# Patient Record
Sex: Female | Born: 1950 | Race: Black or African American | Hispanic: No | Smoking: Never smoker
Health system: Southern US, Community
[De-identification: ages and names within clinical notes are randomized; demographics above are authoritative.]

## PROBLEM LIST (undated history)

## (undated) DIAGNOSIS — K5792 Diverticulitis of intestine, part unspecified, without perforation or abscess without bleeding: Secondary | ICD-10-CM

## (undated) DIAGNOSIS — E785 Hyperlipidemia, unspecified: Secondary | ICD-10-CM

## (undated) DIAGNOSIS — I10 Essential (primary) hypertension: Secondary | ICD-10-CM

## (undated) HISTORY — DX: Hyperlipidemia, unspecified: E78.5

## (undated) HISTORY — DX: Diverticulitis of intestine, part unspecified, without perforation or abscess without bleeding: K57.92

## (undated) HISTORY — DX: Essential (primary) hypertension: I10

---

## 2017-06-27 ENCOUNTER — Ambulatory Visit (INDEPENDENT_AMBULATORY_CARE_PROVIDER_SITE_OTHER): Payer: BLUE CROSS/BLUE SHIELD | Admitting: Family Medicine

## 2017-06-27 ENCOUNTER — Other Ambulatory Visit: Payer: Self-pay

## 2017-06-27 VITALS — BP 144/78 | HR 100 | Temp 98.8°F | Ht 60.0 in | Wt 259.0 lb

## 2017-06-27 DIAGNOSIS — Z7409 Other reduced mobility: Secondary | ICD-10-CM | POA: Diagnosis not present

## 2017-06-27 DIAGNOSIS — S8012XA Contusion of left lower leg, initial encounter: Secondary | ICD-10-CM

## 2017-06-27 DIAGNOSIS — M79662 Pain in left lower leg: Secondary | ICD-10-CM

## 2017-06-27 DIAGNOSIS — K5900 Constipation, unspecified: Secondary | ICD-10-CM | POA: Diagnosis not present

## 2017-06-27 DIAGNOSIS — M7989 Other specified soft tissue disorders: Secondary | ICD-10-CM | POA: Diagnosis not present

## 2017-06-27 MED ORDER — POLYETHYLENE GLYCOL 3350 17 GM/SCOOP PO POWD: 17.0000 g | Freq: Every day | ORAL | 1 refills | Status: DC | PRN

## 2017-06-27 NOTE — Patient Instructions (Signed)
     IF you received an x-ray today, you will receive an invoice from [facility]. Please contact [facility] at [phone] with questions or concerns regarding your invoice.   IF you received labwork today, you will receive an invoice from [facility]. Please contact [facility] at [phone] with questions or concerns regarding your invoice.   Our billing staff will not be able to assist you with questions regarding bills from these companies.  You will be contacted with the lab results as soon as they are available. The fastest way to get your results is to activate your My Chart account. Instructions are located on the last page of this paperwork. If you have not heard from us regarding the results in 2 weeks, please contact this office.     

## 2017-06-29 ENCOUNTER — Encounter: Payer: Self-pay | Admitting: Family Medicine
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
## 2017-07-01 ENCOUNTER — Telehealth: Payer: Self-pay | Admitting: Family Medicine
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
## 2017-07-01 NOTE — Telephone Encounter (Signed)
Completed forms on your desk

## 2017-07-04 NOTE — Telephone Encounter (Signed)
Please advise 

## 2017-07-04 NOTE — Telephone Encounter (Signed)
Paperwork scanned and faxed to number on the sheet on 07/04/17

## 2017-07-04 NOTE — Telephone Encounter (Signed)
Copied from CRM [id number]. Topic: Inquiry >> Jul 04, 2017  9:53 AM Landry MellowFoltz, Melissa J wrote: Reason for CRM: pt needs a letter for work stating the time line that she will be out of work.  Also, she is requesting an update on her fmla.   She has appt on Thursday, but would like to pick up early.  Cb number is [phone]

## 2017-07-07 ENCOUNTER — Telehealth: Payer: Self-pay | Admitting: Family Medicine

## 2017-07-07 ENCOUNTER — Encounter: Payer: Self-pay | Admitting: Family Medicine

## 2017-07-07 ENCOUNTER — Other Ambulatory Visit: Payer: Self-pay

## 2017-07-07 ENCOUNTER — Ambulatory Visit (INDEPENDENT_AMBULATORY_CARE_PROVIDER_SITE_OTHER): Payer: BLUE CROSS/BLUE SHIELD | Admitting: Family Medicine

## 2017-07-07 VITALS — BP 122/74 | HR 105 | Temp 98.8°F | Ht 66.54 in | Wt 260.0 lb

## 2017-07-07 DIAGNOSIS — S8012XD Contusion of left lower leg, subsequent encounter: Secondary | ICD-10-CM | POA: Diagnosis not present

## 2017-07-07 DIAGNOSIS — L97229 Non-pressure chronic ulcer of left calf with unspecified severity: Secondary | ICD-10-CM | POA: Diagnosis not present

## 2017-07-07 DIAGNOSIS — Z7409 Other reduced mobility: Secondary | ICD-10-CM

## 2017-07-07 DIAGNOSIS — M7989 Other specified soft tissue disorders: Secondary | ICD-10-CM | POA: Diagnosis not present

## 2017-07-07 DIAGNOSIS — M79662 Pain in left lower leg: Secondary | ICD-10-CM

## 2017-07-07 MED ORDER — CEPHALEXIN 500 MG PO CAPS
500.0000 mg | ORAL_CAPSULE | Freq: Four times a day (QID) | ORAL | 0 refills | Status: DC
Start: 2017-07-07 — End: 2017-10-21
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
## 2017-07-07 NOTE — Telephone Encounter (Signed)
Ok. So home health will probably not be covered. She needs wound care, her mobility is limited so I would have preferred home health but lets try outpatient wound care, this will hopefully be covered. Please process referral and let patient know. thanks
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
## 2017-07-07 NOTE — Patient Instructions (Signed)
     IF you received an x-ray today, you will receive an invoice from [facility]. Please contact [facility] at [phone] with questions or concerns regarding your invoice.   IF you received labwork today, you will receive an invoice from [facility]. Please contact [facility] at [phone] with questions or concerns regarding your invoice.   Our billing staff will not be able to assist you with questions regarding bills from these companies.  You will be contacted with the lab results as soon as they are available. The fastest way to get your results is to activate your My Chart account. Instructions are located on the last page of this paperwork. If you have not heard from us regarding the results in 2 weeks, please contact this office.     

## 2017-07-08 ENCOUNTER — Ambulatory Visit (HOSPITAL_COMMUNITY)
Admission: RE | Admit: 2017-07-08 | Discharge: 2017-07-08 | Disposition: A | Discharge status: Home / Self Care | Payer: BLUE CROSS/BLUE SHIELD | Source: Ambulatory Visit | Attending: Vascular Surgery | Admitting: Vascular Surgery

## 2017-07-08 ENCOUNTER — Telehealth: Payer: Self-pay | Admitting: Family Medicine

## 2017-07-08 ENCOUNTER — Encounter: Payer: Self-pay | Admitting: Family Medicine

## 2017-07-08 DIAGNOSIS — Z7409 Other reduced mobility: Secondary | ICD-10-CM | POA: Diagnosis not present

## 2017-07-08 DIAGNOSIS — M79662 Pain in left lower leg: Secondary | ICD-10-CM

## 2017-07-08 DIAGNOSIS — M7989 Other specified soft tissue disorders: Secondary | ICD-10-CM | POA: Diagnosis not present

## 2017-07-08 DIAGNOSIS — S8012XD Contusion of left lower leg, subsequent encounter: Secondary | ICD-10-CM

## 2017-07-08 DIAGNOSIS — X58XXXD Exposure to other specified factors, subsequent encounter: Secondary | ICD-10-CM | POA: Insufficient documentation

## 2017-07-08 NOTE — Telephone Encounter (Signed)
Signed letter on your desk

## 2017-07-08 NOTE — Telephone Encounter (Signed)
Noted, results not available yet

## 2017-07-08 NOTE — Telephone Encounter (Signed)
Patient scheduled for Vascular ultrasound of left leg at 3pm today.
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
## 2017-07-08 NOTE — Telephone Encounter (Signed)
Copied from CRM [id number]. Topic: Referral - Request >> Jul 08, 2017 11:22 AM Herby AbrahamJohnson, Shiquita C wrote: Reason for CRM: pt called in to request a referral to a vascular Dr. Rock NephewPt says that she is seeing wound care but also have a blood clot. Wound care advised that they are not able to assist with clot.    Please advise.

## 2017-07-11 NOTE — Telephone Encounter (Signed)
Letter was emailed to pt's daughter on 07/11/17

## 2017-07-12 NOTE — Telephone Encounter (Signed)
I spoke with [name], patient's daughter, regarding us. Patient is scheduled with wound care for tomorrow. Home health not approved.

## 2017-07-12 NOTE — Telephone Encounter (Signed)
Copied from CRM [phone]. Topic: General - Other >> Jul 12, 2017 12:48 PM Terisa Starraylor, Brittany L wrote: Pt's daughter would like a call back from Dr Leretha PolSantiago regarding the phone call they had this morning. Call back is [phone]

## 2017-07-12 NOTE — Telephone Encounter (Signed)
Spoke with daughter, discussed referral to gen surg

## 2017-07-14 ENCOUNTER — Encounter (HOSPITAL_BASED_OUTPATIENT_CLINIC_OR_DEPARTMENT_OTHER): Payer: BLUE CROSS/BLUE SHIELD | Attending: Internal Medicine

## 2017-07-14 DIAGNOSIS — L97822 Non-pressure chronic ulcer of other part of left lower leg with fat layer exposed: Secondary | ICD-10-CM | POA: Diagnosis not present

## 2017-07-14 DIAGNOSIS — L97322 Non-pressure chronic ulcer of left ankle with fat layer exposed: Secondary | ICD-10-CM | POA: Diagnosis present

## 2017-07-14 DIAGNOSIS — L97825 Non-pressure chronic ulcer of other part of left lower leg with muscle involvement without evidence of necrosis: Secondary | ICD-10-CM | POA: Insufficient documentation

## 2017-07-14 DIAGNOSIS — I10 Essential (primary) hypertension: Secondary | ICD-10-CM | POA: Insufficient documentation

## 2017-07-15 ENCOUNTER — Telehealth: Payer: Self-pay | Admitting: Family Medicine

## 2017-07-15 NOTE — Telephone Encounter (Signed)
Pt's daughter [name] called stating that she spoke with pt's insurance company as well as [facility] and they said they can take pt's insurance for Home Health. I sent the referral to [facility] at fax number [phone] on 1/11. [name] asked me to call her when I sent referral. I have received confirmation that referral went through and tried calling [name] but did not get her and vm was full so I could not leave a message. I called the pt to let her know it has been sent so her daughter can give them a call. The pt said [name] gets off of work at 4 so I will try again around 4. If [name] calls back, please let her know referral has been sent. She said she has [facility]'s number and will call to proceed with referral. Pt's temporary address was put on referral as well as [name]'s phone number.

## 2017-07-18 NOTE — Telephone Encounter (Signed)
Unable to reach patient.

## 2017-07-21 ENCOUNTER — Ambulatory Visit: Payer: BLUE CROSS/BLUE SHIELD | Admitting: Family Medicine

## 2017-07-21 ENCOUNTER — Other Ambulatory Visit: Payer: Self-pay

## 2017-07-21 ENCOUNTER — Telehealth: Payer: Self-pay

## 2017-07-21 ENCOUNTER — Encounter: Payer: Self-pay | Admitting: Family Medicine

## 2017-07-21 ENCOUNTER — Ambulatory Visit (INDEPENDENT_AMBULATORY_CARE_PROVIDER_SITE_OTHER): Payer: BLUE CROSS/BLUE SHIELD | Admitting: Family Medicine

## 2017-07-21 VITALS — BP 140/88 | HR 115 | Temp 98.9°F | Resp 18 | Ht 66.54 in | Wt 252.2 lb

## 2017-07-21 DIAGNOSIS — L97825 Non-pressure chronic ulcer of other part of left lower leg with muscle involvement without evidence of necrosis: Secondary | ICD-10-CM | POA: Diagnosis not present

## 2017-07-21 DIAGNOSIS — E785 Hyperlipidemia, unspecified: Secondary | ICD-10-CM | POA: Diagnosis not present

## 2017-07-21 DIAGNOSIS — I10 Essential (primary) hypertension: Secondary | ICD-10-CM | POA: Diagnosis not present

## 2017-07-21 DIAGNOSIS — Z7409 Other reduced mobility: Secondary | ICD-10-CM

## 2017-07-21 DIAGNOSIS — L97229 Non-pressure chronic ulcer of left calf with unspecified severity: Secondary | ICD-10-CM | POA: Diagnosis not present

## 2017-07-21 DIAGNOSIS — S8012XD Contusion of left lower leg, subsequent encounter: Secondary | ICD-10-CM | POA: Diagnosis not present

## 2017-07-21 NOTE — Telephone Encounter (Signed)
Error-Sign Encounter 
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
## 2017-07-21 NOTE — Patient Instructions (Addendum)
We recommend that you schedule a mammogram for breast cancer screening. Typically, you do not need a referral to do this. Please contact a local imaging center to schedule your mammogram.  [facility] - [phone]  *ask for the Radiology Department [facility] - [phone] or [phone]  [facility] - [phone] [facility] - [phone] [facility] - [phone]  *ask for the Radiology Department [facility] - [phone]  *ask for the Radiology Department [facility] - [phone]  *ask for the Mammography Department [facility] - [phone]     IF you received an x-ray today, you will receive an invoice from [facility]. Please contact [facility] at [phone] with questions or concerns regarding your invoice.   IF you received labwork today, you will receive an invoice from [facility]. Please contact [facility] at [phone] with questions or concerns regarding your invoice.   Our billing staff will not be able to assist you with questions regarding bills from these companies.  You will be contacted with the lab results as soon as they are available. The fastest way to get your results is to activate your My Chart account. Instructions are located on the last page of this paperwork. If you have not heard from us regarding the results in 2 weeks, please contact this office.      

## 2017-07-22 ENCOUNTER — Ambulatory Visit: Payer: BLUE CROSS/BLUE SHIELD | Admitting: Family Medicine

## 2017-07-22 ENCOUNTER — Encounter: Payer: Self-pay | Admitting: Family Medicine

## 2017-07-22 LAB — COMPREHENSIVE METABOLIC PANEL
ALT: 6 IU/L (ref 0–32)
AST: 15 IU/L (ref 0–40)
Albumin/Globulin Ratio: 0.8 — ABNORMAL LOW (ref 1.2–2.2)
Albumin: 3.3 g/dL — ABNORMAL LOW (ref 3.6–4.8)
Alkaline Phosphatase: 64 IU/L (ref 39–117)
BUN/Creatinine Ratio: 21 (ref 12–28)
BUN: 14 mg/dL (ref 8–27)
Bilirubin Total: 0.3 mg/dL (ref 0.0–1.2)
CO2: 26 mmol/L (ref 20–29)
Calcium: 9.3 mg/dL (ref 8.7–10.3)
Chloride: 102 mmol/L (ref 96–106)
Creatinine, Ser: 0.66 mg/dL (ref 0.57–1.00)
GFR calc Af Amer: 106 mL/min/{1.73_m2} (ref >59)
GFR calc non Af Amer: 92 mL/min/{1.73_m2} (ref >59)
Globulin, Total: 3.9 g/dL (ref 1.5–4.5)
Glucose: 106 mg/dL — ABNORMAL HIGH (ref 65–99)
Potassium: 4.4 mmol/L (ref 3.5–5.2)
Sodium: 142 mmol/L (ref 134–144)
Total Protein: 7.2 g/dL (ref 6.0–8.5)

## 2017-07-22 LAB — CBC
Hematocrit: 28.1 % — ABNORMAL LOW (ref 34.0–46.6)
Hemoglobin: 8.6 g/dL — ABNORMAL LOW (ref 11.1–15.9)
MCH: 28.5 pg (ref 26.6–33.0)
MCHC: 30.6 g/dL — ABNORMAL LOW (ref 31.5–35.7)
MCV: 93 fL (ref 79–97)
Platelets: 551 10*3/uL — ABNORMAL HIGH (ref 150–379)
RBC: 3.02 x10E6/uL — ABNORMAL LOW (ref 3.77–5.28)
RDW: 14.7 % (ref 12.3–15.4)
WBC: 10.5 10*3/uL (ref 3.4–10.8)

## 2017-07-22 LAB — TSH: TSH: 0.321 u[IU]/mL — ABNORMAL LOW (ref 0.450–4.500)

## 2017-07-22 LAB — LIPID PANEL
Chol/HDL Ratio: 4 ratio (ref 0.0–4.4)
Cholesterol, Total: 195 mg/dL (ref 100–199)
HDL: 49 mg/dL (ref >39)
LDL Calculated: 129 mg/dL — ABNORMAL HIGH (ref 0–99)
Triglycerides: 84 mg/dL (ref 0–149)
VLDL Cholesterol Cal: 17 mg/dL (ref 5–40)

## 2017-07-22 LAB — HEMOGLOBIN A1C
Est. average glucose Bld gHb Est-mCnc: 105 mg/dL
Hgb A1c MFr Bld: 5.3 % (ref 4.8–5.6)

## 2017-07-28 ENCOUNTER — Ambulatory Visit: Payer: BLUE CROSS/BLUE SHIELD | Admitting: Family Medicine

## 2017-07-28 ENCOUNTER — Telehealth: Payer: Self-pay | Admitting: Family Medicine

## 2017-07-28 ENCOUNTER — Other Ambulatory Visit: Payer: Self-pay | Admitting: Family Medicine

## 2017-07-28 DIAGNOSIS — R7989 Other specified abnormal findings of blood chemistry: Secondary | ICD-10-CM

## 2017-07-28 DIAGNOSIS — D649 Anemia, unspecified: Secondary | ICD-10-CM

## 2017-07-28 NOTE — Telephone Encounter (Signed)
Verbal order given  

## 2017-07-28 NOTE — Progress Notes (Signed)
Spoke with pt regarding levels. She will be making an appt to get more lab work done.

## 2017-07-28 NOTE — Telephone Encounter (Signed)
Copied from CRM [id number]. Topic: Quick Communication - See Telephone Encounter >> Jul 28, 2017 10:12 AM Guinevere FerrariMorris, Conny Situ E, NT wrote: CRM for notification. See Telephone encounter for: Clydie BraunShaunda is calling to get verbal orders for home health PT for once a week for one week and twice a week for two weeks. She would like a call back at [phone]  07/28/17.

## 2017-07-29 DIAGNOSIS — L97825 Non-pressure chronic ulcer of other part of left lower leg with muscle involvement without evidence of necrosis: Secondary | ICD-10-CM | POA: Diagnosis not present

## 2017-08-01 ENCOUNTER — Telehealth: Payer: Self-pay | Admitting: Family Medicine

## 2017-08-01 ENCOUNTER — Telehealth: Payer: Self-pay

## 2017-08-01 NOTE — Telephone Encounter (Signed)
Almira CoasterAngel Routh, RN with [facility] called on behalf of the patient about the increased pain she's having in there left leg where the hematoma is located, the patient gave consent to speak to ClarksonAngel. Lawanna Kobusngel says "when I arrived, the wound care was done. The leg where the hematoma was wrapped in an ace bandage, which was removed. The leg is a little red, but not warm. Her temp is 99.5, P 106. The patient reported having increased pain in the leg above the wound since going to the wound center and having it wrapped with 3 layers of dressing. She said the patient said she's hurt all weekend and her daughter is asking for something for pain and any suggestions the provider may have. The patient said she took 3 ES Tylenol at one time and it did not help her pain." Lawanna Kobusngel said "I will educate her on the tylenol dosage, but she is wanting something for pain." I advised I will send this over to the provider and if an appointment will need to be made, her daughter [name] will be notified. The number for [name] is [phone].
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
## 2017-08-02 ENCOUNTER — Ambulatory Visit (INDEPENDENT_AMBULATORY_CARE_PROVIDER_SITE_OTHER): Payer: BLUE CROSS/BLUE SHIELD | Admitting: Family Medicine

## 2017-08-02 ENCOUNTER — Encounter: Payer: Self-pay | Admitting: Family Medicine

## 2017-08-02 ENCOUNTER — Other Ambulatory Visit: Payer: Self-pay

## 2017-08-02 VITALS — BP 140/72 | HR 100 | Temp 99.1°F | Ht 65.0 in | Wt 252.0 lb

## 2017-08-02 DIAGNOSIS — Z7409 Other reduced mobility: Secondary | ICD-10-CM

## 2017-08-02 DIAGNOSIS — M7989 Other specified soft tissue disorders: Secondary | ICD-10-CM | POA: Diagnosis not present

## 2017-08-02 DIAGNOSIS — M79662 Pain in left lower leg: Secondary | ICD-10-CM | POA: Diagnosis not present

## 2017-08-02 DIAGNOSIS — L97229 Non-pressure chronic ulcer of left calf with unspecified severity: Secondary | ICD-10-CM | POA: Diagnosis not present

## 2017-08-02 DIAGNOSIS — S8012XD Contusion of left lower leg, subsequent encounter: Secondary | ICD-10-CM | POA: Diagnosis not present

## 2017-08-02 MED ORDER — TRAMADOL HCL 50 MG PO TABS: 50.0000 mg | ORAL_TABLET | Freq: Three times a day (TID) | ORAL | 0 refills | Status: DC | PRN

## 2017-08-02 MED ORDER — IBUPROFEN 800 MG PO TABS
800.0000 mg | ORAL_TABLET | Freq: Three times a day (TID) | ORAL | 0 refills | Status: DC | PRN
Start: 2017-08-02 — End: 2017-09-20

## 2017-08-02 NOTE — Patient Instructions (Signed)
     IF you received an x-ray today, you will receive an invoice from [facility]. Please contact [facility] at [phone] with questions or concerns regarding your invoice.   IF you received labwork today, you will receive an invoice from [facility]. Please contact [facility] at [phone] with questions or concerns regarding your invoice.   Our billing staff will not be able to assist you with questions regarding bills from these companies.  You will be contacted with the lab results as soon as they are available. The fastest way to get your results is to activate your My Chart account. Instructions are located on the last page of this paperwork. If you have not heard from us regarding the results in 2 weeks, please contact this office.     
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
## 2017-08-03 ENCOUNTER — Telehealth: Payer: Self-pay | Admitting: Family Medicine

## 2017-08-03 NOTE — Telephone Encounter (Signed)
Patients daughter needs FMLA forms completed for intermitted time off to take her mother to her appointments. I have completed what I could from the OV notes but I was not sure about the time frame you wanted to give her for the appointments. I will place the forms in Dr Adela GlimpseSantiago's box on 08/03/17 please return to the FMLA/Disability desk within 5-7 business days. Thank you!

## 2017-08-03 NOTE — Telephone Encounter (Signed)
Patient seen yesterday and pain needs meet.

## 2017-08-05 ENCOUNTER — Encounter (HOSPITAL_BASED_OUTPATIENT_CLINIC_OR_DEPARTMENT_OTHER): Payer: BLUE CROSS/BLUE SHIELD | Attending: Internal Medicine

## 2017-08-05 DIAGNOSIS — L97825 Non-pressure chronic ulcer of other part of left lower leg with muscle involvement without evidence of necrosis: Secondary | ICD-10-CM | POA: Insufficient documentation

## 2017-08-05 DIAGNOSIS — L98499 Non-pressure chronic ulcer of skin of other sites with unspecified severity: Secondary | ICD-10-CM | POA: Diagnosis not present

## 2017-08-05 DIAGNOSIS — L97822 Non-pressure chronic ulcer of other part of left lower leg with fat layer exposed: Secondary | ICD-10-CM | POA: Diagnosis not present

## 2017-08-05 DIAGNOSIS — I87323 Chronic venous hypertension (idiopathic) with inflammation of bilateral lower extremity: Secondary | ICD-10-CM | POA: Diagnosis not present

## 2017-08-05 DIAGNOSIS — I890 Lymphedema, not elsewhere classified: Secondary | ICD-10-CM | POA: Diagnosis not present

## 2017-08-05 DIAGNOSIS — I10 Essential (primary) hypertension: Secondary | ICD-10-CM | POA: Insufficient documentation

## 2017-08-05 NOTE — Telephone Encounter (Signed)
Completed, back on your desk in 104.

## 2017-08-06 ENCOUNTER — Ambulatory Visit: Payer: BLUE CROSS/BLUE SHIELD | Admitting: Family Medicine

## 2017-08-08 NOTE — Telephone Encounter (Signed)
Paperwork scanned and faxed on 08/08/17 °

## 2017-08-09 ENCOUNTER — Telehealth: Payer: Self-pay | Admitting: Family Medicine

## 2017-08-09 NOTE — Telephone Encounter (Signed)
Copied from CRM [id number]. Topic: Inquiry >> Aug 09, 2017 12:24 PM Diana EvesHoyt, Maryann B wrote: Reason for CRM: pt calling in to confirm extension of disability paper work was received and to let office know [employer] needs it faxed back asap.

## 2017-08-12 ENCOUNTER — Other Ambulatory Visit (HOSPITAL_COMMUNITY)
Admission: RE | Admit: 2017-08-12 | Discharge: 2017-08-12 | Disposition: A | Discharge status: Home / Self Care | Payer: BLUE CROSS/BLUE SHIELD | Source: Other Acute Inpatient Hospital | Attending: Internal Medicine | Admitting: Internal Medicine

## 2017-08-12 DIAGNOSIS — L97122 Non-pressure chronic ulcer of left thigh with fat layer exposed: Secondary | ICD-10-CM | POA: Insufficient documentation

## 2017-08-12 DIAGNOSIS — L97825 Non-pressure chronic ulcer of other part of left lower leg with muscle involvement without evidence of necrosis: Secondary | ICD-10-CM | POA: Diagnosis not present

## 2017-08-16 LAB — AEROBIC CULTURE  (SUPERFICIAL SPECIMEN): GRAM STAIN: NONE SEEN

## 2017-08-16 LAB — AEROBIC CULTURE W GRAM STAIN (SUPERFICIAL SPECIMEN)

## 2017-08-16 NOTE — Telephone Encounter (Signed)
Forms were updated and faxed in on 08/16/17

## 2017-08-16 NOTE — Telephone Encounter (Signed)
Pt calling and states her job, [employer] has not received this paperwork and she would like an update regarding this matter.

## 2017-08-19 DIAGNOSIS — L97825 Non-pressure chronic ulcer of other part of left lower leg with muscle involvement without evidence of necrosis: Secondary | ICD-10-CM | POA: Diagnosis not present

## 2017-08-26 ENCOUNTER — Other Ambulatory Visit (HOSPITAL_COMMUNITY)
Admission: RE | Admit: 2017-08-26 | Discharge: 2017-08-26 | Disposition: A | Discharge status: Home / Self Care | Payer: BLUE CROSS/BLUE SHIELD | Source: Other Acute Inpatient Hospital | Attending: Internal Medicine | Admitting: Internal Medicine

## 2017-08-26 DIAGNOSIS — L97825 Non-pressure chronic ulcer of other part of left lower leg with muscle involvement without evidence of necrosis: Secondary | ICD-10-CM | POA: Diagnosis not present

## 2017-08-26 DIAGNOSIS — L97122 Non-pressure chronic ulcer of left thigh with fat layer exposed: Secondary | ICD-10-CM | POA: Diagnosis present

## 2017-08-28 LAB — AEROBIC CULTURE  (SUPERFICIAL SPECIMEN)

## 2017-08-28 LAB — AEROBIC CULTURE W GRAM STAIN (SUPERFICIAL SPECIMEN): Gram Stain: NONE SEEN

## 2017-09-02 ENCOUNTER — Encounter (HOSPITAL_BASED_OUTPATIENT_CLINIC_OR_DEPARTMENT_OTHER): Payer: BLUE CROSS/BLUE SHIELD | Attending: Internal Medicine

## 2017-09-02 DIAGNOSIS — I10 Essential (primary) hypertension: Secondary | ICD-10-CM | POA: Diagnosis not present

## 2017-09-02 DIAGNOSIS — L89893 Pressure ulcer of other site, stage 3: Secondary | ICD-10-CM | POA: Diagnosis present

## 2017-09-02 DIAGNOSIS — L97822 Non-pressure chronic ulcer of other part of left lower leg with fat layer exposed: Secondary | ICD-10-CM | POA: Diagnosis not present

## 2017-09-02 DIAGNOSIS — L97125 Non-pressure chronic ulcer of left thigh with muscle involvement without evidence of necrosis: Secondary | ICD-10-CM | POA: Diagnosis not present

## 2017-09-02 DIAGNOSIS — I87322 Chronic venous hypertension (idiopathic) with inflammation of left lower extremity: Secondary | ICD-10-CM | POA: Diagnosis not present

## 2017-09-02 DIAGNOSIS — A4901 Methicillin susceptible Staphylococcus aureus infection, unspecified site: Secondary | ICD-10-CM | POA: Diagnosis not present

## 2017-09-09 DIAGNOSIS — L89893 Pressure ulcer of other site, stage 3: Secondary | ICD-10-CM | POA: Diagnosis not present

## 2017-09-12 ENCOUNTER — Other Ambulatory Visit: Payer: Self-pay | Admitting: Family Medicine

## 2017-09-16 ENCOUNTER — Other Ambulatory Visit (HOSPITAL_COMMUNITY)
Admission: RE | Admit: 2017-09-16 | Discharge: 2017-09-16 | Disposition: A | Discharge status: Home / Self Care | Payer: BLUE CROSS/BLUE SHIELD | Source: Other Acute Inpatient Hospital | Attending: Internal Medicine | Admitting: Internal Medicine

## 2017-09-16 DIAGNOSIS — L97122 Non-pressure chronic ulcer of left thigh with fat layer exposed: Secondary | ICD-10-CM | POA: Insufficient documentation

## 2017-09-16 DIAGNOSIS — L89893 Pressure ulcer of other site, stage 3: Secondary | ICD-10-CM | POA: Diagnosis not present

## 2017-09-19 ENCOUNTER — Other Ambulatory Visit: Payer: Self-pay | Admitting: Family Medicine

## 2017-09-19 LAB — AEROBIC CULTURE W GRAM STAIN (SUPERFICIAL SPECIMEN)

## 2017-09-19 LAB — AEROBIC CULTURE  (SUPERFICIAL SPECIMEN)

## 2017-09-19 NOTE — Telephone Encounter (Signed)
Copied from CRM [id number]. Topic: Quick Communication - Rx Refill/Question >> Sep 19, 2017 12:38 PM Avie ArenasSimmons, Jadon Harbaugh L, NT wrote: Medication: ibuprofen (ADVIL,MOTRIN) 800 MG tablet and alsotraMADol (ULTRAM) 50 MG tablet Has the patient contacted their pharmacy? {yes (Agent: If no, request that the patient contact the pharmacy for the refill.) Preferred Pharmacy (with phone number or street name):[facility] [address] - [place] ([address]), [place] - [address] [phone] (Phone) [phone] (Fax Agent: Please be advised that RX refills may take up to 3 business days. We ask that you follow-up with your pharmacy. Patient has had the wound  vac on for almost 2 weeks and would like a refill on the above 2 medications  , she said she is in a lot of a lot of pain due them going into the wound, please call her with any questions at  [phone]

## 2017-09-20 MED ORDER — TRAMADOL HCL 50 MG PO TABS: 50.0000 mg | ORAL_TABLET | Freq: Three times a day (TID) | ORAL | 0 refills | Status: DC | PRN

## 2017-09-20 MED ORDER — IBUPROFEN 800 MG PO TABS: 800.0000 mg | ORAL_TABLET | Freq: Three times a day (TID) | ORAL | 0 refills | Status: DC | PRN

## 2017-09-20 NOTE — Telephone Encounter (Signed)
LOV  08/02/17 Dr. Ignatius SpeckingSantiago [facility] [address]

## 2017-09-20 NOTE — Telephone Encounter (Signed)
Dr Leretha PolSantiago please advise dgaddy

## 2017-09-23 DIAGNOSIS — L89893 Pressure ulcer of other site, stage 3: Secondary | ICD-10-CM | POA: Diagnosis not present

## 2017-09-29 ENCOUNTER — Telehealth: Payer: Self-pay | Admitting: Family Medicine
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
## 2017-09-29 NOTE — Telephone Encounter (Signed)
[facility] wound care following pt on wounds. Verbal order given for blood draws.

## 2017-09-30 DIAGNOSIS — L89893 Pressure ulcer of other site, stage 3: Secondary | ICD-10-CM | POA: Diagnosis not present

## 2017-10-03 ENCOUNTER — Encounter: Payer: Self-pay | Admitting: Family Medicine

## 2017-10-07 ENCOUNTER — Encounter (HOSPITAL_BASED_OUTPATIENT_CLINIC_OR_DEPARTMENT_OTHER): Payer: BLUE CROSS/BLUE SHIELD | Attending: Internal Medicine

## 2017-10-07 DIAGNOSIS — I10 Essential (primary) hypertension: Secondary | ICD-10-CM | POA: Insufficient documentation

## 2017-10-07 DIAGNOSIS — L97822 Non-pressure chronic ulcer of other part of left lower leg with fat layer exposed: Secondary | ICD-10-CM | POA: Diagnosis not present

## 2017-10-07 DIAGNOSIS — I890 Lymphedema, not elsewhere classified: Secondary | ICD-10-CM | POA: Diagnosis not present

## 2017-10-07 DIAGNOSIS — B9562 Methicillin resistant Staphylococcus aureus infection as the cause of diseases classified elsewhere: Secondary | ICD-10-CM | POA: Insufficient documentation

## 2017-10-07 DIAGNOSIS — L02416 Cutaneous abscess of left lower limb: Secondary | ICD-10-CM | POA: Diagnosis not present

## 2017-10-12 ENCOUNTER — Telehealth: Payer: Self-pay | Admitting: Family Medicine

## 2017-10-12 NOTE — Telephone Encounter (Signed)
Spoke with Melissa Forbes and agreed on her assessment of pt needing office visit. Pt wound on leg is worsening and platelet count is elevated. Please schedule with [place].

## 2017-10-12 NOTE — Telephone Encounter (Signed)
Copied from CRM [id number]. Topic: Quick Communication - See Telephone Encounter >> Oct 12, 2017 11:57 AM Rudi CocoLathan, Raney Antwine M, NT wrote: CRM for notification. See Telephone encounter for: 10/12/17.   Fleet ContrasRachel calling from [facility] needing to speak with Dr. Melynda Kellerr nurse about pt. Fleet ContrasRachel can be reached at [phone]  Pt. B/p is 150-90 which is higher than base line Wounds on left leg draining (does see wound clinic) Platelet count is 680 (high)

## 2017-10-13 NOTE — Telephone Encounter (Signed)
Called pt to try and make an appt with [place] to check her leg wound. Pt advised that her daughter is the one who brings her to the appts and she will have to check with her schedule. She will call the office and schedule an appt as soon as she is able.

## 2017-10-14 DIAGNOSIS — L97822 Non-pressure chronic ulcer of other part of left lower leg with fat layer exposed: Secondary | ICD-10-CM | POA: Diagnosis not present

## 2017-10-21 ENCOUNTER — Other Ambulatory Visit: Payer: Self-pay

## 2017-10-21 ENCOUNTER — Other Ambulatory Visit (HOSPITAL_BASED_OUTPATIENT_CLINIC_OR_DEPARTMENT_OTHER): Payer: Self-pay | Admitting: Internal Medicine

## 2017-10-21 ENCOUNTER — Encounter: Payer: Self-pay | Admitting: Family Medicine

## 2017-10-21 ENCOUNTER — Ambulatory Visit (INDEPENDENT_AMBULATORY_CARE_PROVIDER_SITE_OTHER): Payer: BLUE CROSS/BLUE SHIELD | Admitting: Family Medicine

## 2017-10-21 ENCOUNTER — Other Ambulatory Visit (HOSPITAL_COMMUNITY)
Admission: RE | Admit: 2017-10-21 | Discharge: 2017-10-21 | Disposition: A | Discharge status: Home / Self Care | Payer: BLUE CROSS/BLUE SHIELD | Attending: Internal Medicine | Admitting: Internal Medicine

## 2017-10-21 ENCOUNTER — Ambulatory Visit (HOSPITAL_COMMUNITY)
Admission: RE | Admit: 2017-10-21 | Discharge: 2017-10-21 | Disposition: A | Discharge status: Home / Self Care | Payer: BLUE CROSS/BLUE SHIELD | Source: Ambulatory Visit | Attending: Internal Medicine | Admitting: Internal Medicine

## 2017-10-21 VITALS — BP 124/62 | HR 96 | Temp 97.9°F | Ht 60.24 in | Wt 230.0 lb

## 2017-10-21 DIAGNOSIS — I10 Essential (primary) hypertension: Secondary | ICD-10-CM

## 2017-10-21 DIAGNOSIS — D649 Anemia, unspecified: Secondary | ICD-10-CM

## 2017-10-21 DIAGNOSIS — S8012XD Contusion of left lower leg, subsequent encounter: Secondary | ICD-10-CM | POA: Diagnosis not present

## 2017-10-21 DIAGNOSIS — L97129 Non-pressure chronic ulcer of left thigh with unspecified severity: Secondary | ICD-10-CM | POA: Diagnosis not present

## 2017-10-21 DIAGNOSIS — Z7409 Other reduced mobility: Secondary | ICD-10-CM

## 2017-10-21 DIAGNOSIS — R7989 Other specified abnormal findings of blood chemistry: Secondary | ICD-10-CM | POA: Diagnosis not present

## 2017-10-21 DIAGNOSIS — L97122 Non-pressure chronic ulcer of left thigh with fat layer exposed: Secondary | ICD-10-CM | POA: Diagnosis present
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
## 2017-10-21 NOTE — Patient Instructions (Signed)
     IF you received an x-ray today, you will receive an invoice from [facility]. Please contact [facility] at [phone] with questions or concerns regarding your invoice.   IF you received labwork today, you will receive an invoice from [facility]. Please contact [facility] at [phone] with questions or concerns regarding your invoice.   Our billing staff will not be able to assist you with questions regarding bills from these companies.  You will be contacted with the lab results as soon as they are available. The fastest way to get your results is to activate your My Chart account. Instructions are located on the last page of this paperwork. If you have not heard from us regarding the results in 2 weeks, please contact this office.     

## 2017-10-22 LAB — CBC WITH DIFFERENTIAL/PLATELET
Basophils Absolute: 0 10*3/uL (ref 0.0–0.2)
Basos: 0 %
EOS (ABSOLUTE): 0.3 10*3/uL (ref 0.0–0.4)
Eos: 3 %
Hematocrit: 25.8 % — ABNORMAL LOW (ref 34.0–46.6)
Hemoglobin: 8.2 g/dL — ABNORMAL LOW (ref 11.1–15.9)
Immature Grans (Abs): 0.1 10*3/uL (ref 0.0–0.1)
Immature Granulocytes: 1 %
Lymphocytes Absolute: 3 10*3/uL (ref 0.7–3.1)
Lymphs: 28 %
MCH: 26.9 pg (ref 26.6–33.0)
MCHC: 31.8 g/dL (ref 31.5–35.7)
MCV: 85 fL (ref 79–97)
Monocytes Absolute: 0.9 10*3/uL (ref 0.1–0.9)
Monocytes: 9 %
Neutrophils Absolute: 6.4 10*3/uL (ref 1.4–7.0)
Neutrophils: 59 %
Platelets: 570 10*3/uL — ABNORMAL HIGH (ref 150–379)
RBC: 3.05 x10E6/uL — ABNORMAL LOW (ref 3.77–5.28)
RDW: 19.5 % — ABNORMAL HIGH (ref 12.3–15.4)
WBC: 10.7 10*3/uL (ref 3.4–10.8)

## 2017-10-22 LAB — IRON,TIBC AND FERRITIN PANEL
Ferritin: 345 ng/mL — ABNORMAL HIGH (ref 15–150)
Iron Saturation: 17 % (ref 15–55)
Iron: 37 ug/dL (ref 27–139)
Total Iron Binding Capacity: 213 ug/dL — ABNORMAL LOW (ref 250–450)
UIBC: 176 ug/dL (ref 118–369)

## 2017-10-22 LAB — TSH: TSH: 0.928 u[IU]/mL (ref 0.450–4.500)

## 2017-10-22 LAB — T4, FREE: Free T4: 1.2 ng/dL (ref 0.82–1.77)

## 2017-10-24 ENCOUNTER — Ambulatory Visit (HOSPITAL_COMMUNITY)
Admission: RE | Admit: 2017-10-24 | Discharge: 2017-10-24 | Disposition: A | Discharge status: Home / Self Care | Payer: BLUE CROSS/BLUE SHIELD | Source: Ambulatory Visit | Attending: Internal Medicine | Admitting: Internal Medicine

## 2017-10-24 ENCOUNTER — Other Ambulatory Visit (HOSPITAL_BASED_OUTPATIENT_CLINIC_OR_DEPARTMENT_OTHER): Payer: Self-pay | Admitting: Internal Medicine

## 2017-10-24 DIAGNOSIS — L97122 Non-pressure chronic ulcer of left thigh with fat layer exposed: Secondary | ICD-10-CM

## 2017-10-24 DIAGNOSIS — M7989 Other specified soft tissue disorders: Secondary | ICD-10-CM | POA: Insufficient documentation

## 2017-10-24 LAB — AEROBIC CULTURE  (SUPERFICIAL SPECIMEN)

## 2017-10-24 LAB — AEROBIC CULTURE W GRAM STAIN (SUPERFICIAL SPECIMEN)

## 2017-10-25 ENCOUNTER — Encounter (HOSPITAL_COMMUNITY): Payer: Self-pay

## 2017-10-25 ENCOUNTER — Other Ambulatory Visit (INDEPENDENT_AMBULATORY_CARE_PROVIDER_SITE_OTHER): Payer: Self-pay | Admitting: Orthopedic Surgery

## 2017-10-25 ENCOUNTER — Inpatient Hospital Stay (HOSPITAL_COMMUNITY)
Admission: AD | Admit: 2017-10-25 | Discharge: 2017-10-31 | DRG: 574 | Disposition: A | Discharge status: Home Health | Payer: BLUE CROSS/BLUE SHIELD | Source: Ambulatory Visit | Attending: Family Medicine | Admitting: Family Medicine

## 2017-10-25 ENCOUNTER — Other Ambulatory Visit: Payer: Self-pay

## 2017-10-25 DIAGNOSIS — D638 Anemia in other chronic diseases classified elsewhere: Secondary | ICD-10-CM | POA: Diagnosis present

## 2017-10-25 DIAGNOSIS — Z79899 Other long term (current) drug therapy: Secondary | ICD-10-CM

## 2017-10-25 DIAGNOSIS — D62 Acute posthemorrhagic anemia: Secondary | ICD-10-CM | POA: Diagnosis not present

## 2017-10-25 DIAGNOSIS — N182 Chronic kidney disease, stage 2 (mild): Secondary | ICD-10-CM | POA: Diagnosis present

## 2017-10-25 DIAGNOSIS — D649 Anemia, unspecified: Secondary | ICD-10-CM | POA: Diagnosis not present

## 2017-10-25 DIAGNOSIS — L97229 Non-pressure chronic ulcer of left calf with unspecified severity: Secondary | ICD-10-CM | POA: Diagnosis present

## 2017-10-25 DIAGNOSIS — I129 Hypertensive chronic kidney disease with stage 1 through stage 4 chronic kidney disease, or unspecified chronic kidney disease: Secondary | ICD-10-CM | POA: Diagnosis present

## 2017-10-25 DIAGNOSIS — E785 Hyperlipidemia, unspecified: Secondary | ICD-10-CM | POA: Diagnosis present

## 2017-10-25 DIAGNOSIS — E669 Obesity, unspecified: Secondary | ICD-10-CM | POA: Diagnosis not present

## 2017-10-25 DIAGNOSIS — E119 Type 2 diabetes mellitus without complications: Secondary | ICD-10-CM | POA: Diagnosis not present

## 2017-10-25 DIAGNOSIS — I10 Essential (primary) hypertension: Secondary | ICD-10-CM

## 2017-10-25 DIAGNOSIS — L0291 Cutaneous abscess, unspecified: Secondary | ICD-10-CM | POA: Diagnosis present

## 2017-10-25 DIAGNOSIS — I872 Venous insufficiency (chronic) (peripheral): Secondary | ICD-10-CM | POA: Diagnosis present

## 2017-10-25 DIAGNOSIS — B9562 Methicillin resistant Staphylococcus aureus infection as the cause of diseases classified elsewhere: Secondary | ICD-10-CM | POA: Diagnosis present

## 2017-10-25 DIAGNOSIS — L02416 Cutaneous abscess of left lower limb: Secondary | ICD-10-CM

## 2017-10-25 DIAGNOSIS — Z6841 Body Mass Index (BMI) 40.0 and over, adult: Secondary | ICD-10-CM | POA: Diagnosis not present

## 2017-10-25 DIAGNOSIS — M7989 Other specified soft tissue disorders: Secondary | ICD-10-CM | POA: Diagnosis present

## 2017-10-25 DIAGNOSIS — L97122 Non-pressure chronic ulcer of left thigh with fat layer exposed: Secondary | ICD-10-CM | POA: Diagnosis present

## 2017-10-25 DIAGNOSIS — D500 Iron deficiency anemia secondary to blood loss (chronic): Secondary | ICD-10-CM | POA: Diagnosis not present

## 2017-10-25 DIAGNOSIS — L03116 Cellulitis of left lower limb: Secondary | ICD-10-CM | POA: Diagnosis present

## 2017-10-25 LAB — GLUCOSE, CAPILLARY: Glucose-Capillary: 119 mg/dL — ABNORMAL HIGH (ref 65–99)

## 2017-10-25 LAB — MRSA PCR SCREENING: MRSA by PCR: POSITIVE — AB

## 2017-10-25 MED ORDER — OXYCODONE HCL 5 MG PO TABS: 5.0000 mg | ORAL_TABLET | Freq: Four times a day (QID) | ORAL | Status: DC | PRN

## 2017-10-25 MED ORDER — CHLORHEXIDINE GLUCONATE CLOTH 2 % EX PADS
6.0000 | MEDICATED_PAD | Freq: Every day | CUTANEOUS | Status: AC
  Administered 2017-10-26 – 2017-10-31 (×5): 6 via TOPICAL

## 2017-10-25 MED ORDER — MUPIROCIN 2 % EX OINT
1.0000 "application " | TOPICAL_OINTMENT | Freq: Two times a day (BID) | CUTANEOUS | Status: AC
  Administered 2017-10-25 – 2017-10-30 (×9): 1 via NASAL
  Filled 2017-10-25 (×3): qty 22

## 2017-10-25 MED ORDER — VANCOMYCIN HCL 10 G IV SOLR
2000.0000 mg | Freq: Once | INTRAVENOUS | Status: AC
  Administered 2017-10-25: 2000 mg via INTRAVENOUS
  Filled 2017-10-25: qty 2000

## 2017-10-25 MED ORDER — TRAMADOL HCL 50 MG PO TABS
50.0000 mg | ORAL_TABLET | Freq: Four times a day (QID) | ORAL | Status: DC | PRN
  Administered 2017-10-25: 50 mg via ORAL
  Filled 2017-10-25: qty 1

## 2017-10-25 MED ORDER — SENNA 8.6 MG PO TABS
1.0000 | ORAL_TABLET | Freq: Every day | ORAL | Status: DC
  Administered 2017-10-25 – 2017-10-31 (×5): 8.6 mg via ORAL
  Filled 2017-10-25 (×6): qty 1

## 2017-10-25 MED ORDER — PIPERACILLIN-TAZOBACTAM 3.375 G IVPB
3.3750 g | Freq: Three times a day (TID) | INTRAVENOUS | Status: DC
  Administered 2017-10-25 – 2017-10-29 (×10): 3.375 g via INTRAVENOUS
  Filled 2017-10-25 (×14): qty 50

## 2017-10-25 MED ORDER — VANCOMYCIN HCL 10 G IV SOLR
1750.0000 mg | INTRAVENOUS | Status: DC
  Administered 2017-10-27 – 2017-10-30 (×4): 1750 mg via INTRAVENOUS
  Filled 2017-10-25 (×6): qty 1750

## 2017-10-25 MED ORDER — ACETAMINOPHEN 325 MG PO TABS: 650.0000 mg | ORAL_TABLET | Freq: Four times a day (QID) | ORAL | Status: DC | PRN

## 2017-10-25 MED ORDER — SODIUM CHLORIDE 0.9 % IV SOLN
INTRAVENOUS | Status: DC
  Administered 2017-10-25 – 2017-10-26 (×2): via INTRAVENOUS
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
## 2017-10-26 ENCOUNTER — Inpatient Hospital Stay (HOSPITAL_COMMUNITY): Payer: BLUE CROSS/BLUE SHIELD | Admitting: Anesthesiology

## 2017-10-26 ENCOUNTER — Encounter (HOSPITAL_COMMUNITY): Payer: Self-pay

## 2017-10-26 ENCOUNTER — Encounter (HOSPITAL_COMMUNITY)
Admission: AD | Disposition: A | Discharge status: Home Health | Payer: Self-pay | Source: Ambulatory Visit | Attending: Internal Medicine

## 2017-10-26 DIAGNOSIS — D649 Anemia, unspecified: Secondary | ICD-10-CM

## 2017-10-26 DIAGNOSIS — L03116 Cellulitis of left lower limb: Secondary | ICD-10-CM

## 2017-10-26 DIAGNOSIS — L02416 Cutaneous abscess of left lower limb: Principal | ICD-10-CM

## 2017-10-26 DIAGNOSIS — L0291 Cutaneous abscess, unspecified: Secondary | ICD-10-CM

## 2017-10-26 HISTORY — PX: I & D EXTREMITY: SHX5045

## 2017-10-26 HISTORY — PX: APPLICATION OF WOUND VAC: SHX5189

## 2017-10-26 LAB — COMPREHENSIVE METABOLIC PANEL
ALT: 8 U/L — ABNORMAL LOW (ref 14–54)
AST: 14 U/L — ABNORMAL LOW (ref 15–41)
Albumin: 2.4 g/dL — ABNORMAL LOW (ref 3.5–5.0)
Alkaline Phosphatase: 37 U/L — ABNORMAL LOW (ref 38–126)
Anion gap: 9 (ref 5–15)
BUN: 14 mg/dL (ref 6–20)
CHLORIDE: 104 mmol/L (ref 101–111)
CO2: 25 mmol/L (ref 22–32)
Calcium: 8.8 mg/dL — ABNORMAL LOW (ref 8.9–10.3)
Creatinine, Ser: 0.76 mg/dL (ref 0.44–1.00)
Glucose, Bld: 102 mg/dL — ABNORMAL HIGH (ref 65–99)
POTASSIUM: 3.7 mmol/L (ref 3.5–5.1)
Sodium: 138 mmol/L (ref 135–145)
Total Bilirubin: 0.6 mg/dL (ref 0.3–1.2)
Total Protein: 7 g/dL (ref 6.5–8.1)

## 2017-10-26 LAB — CBC
HCT: 24.2 % — ABNORMAL LOW (ref 36.0–46.0)
HCT: 27.3 % — ABNORMAL LOW (ref 36.0–46.0)
HEMOGLOBIN: 8.1 g/dL — AB (ref 12.0–15.0)
Hemoglobin: 7.4 g/dL — ABNORMAL LOW (ref 12.0–15.0)
MCH: 27.1 pg (ref 26.0–34.0)
MCH: 26.1 pg (ref 26.0–34.0)
MCHC: 30.6 g/dL (ref 30.0–36.0)
MCHC: 29.7 g/dL — ABNORMAL LOW (ref 30.0–36.0)
MCV: 88.6 fL (ref 78.0–100.0)
MCV: 88.1 fL (ref 78.0–100.0)
PLATELETS: 456 10*3/uL — AB (ref 150–400)
Platelets: 464 10*3/uL — ABNORMAL HIGH (ref 150–400)
RBC: 2.73 MIL/uL — ABNORMAL LOW (ref 3.87–5.11)
RBC: 3.1 MIL/uL — ABNORMAL LOW (ref 3.87–5.11)
RDW: 20.9 % — AB (ref 11.5–15.5)
RDW: 20.5 % — ABNORMAL HIGH (ref 11.5–15.5)
WBC: 7.7 10*3/uL (ref 4.0–10.5)
WBC: 9.5 10*3/uL (ref 4.0–10.5)

## 2017-10-26 LAB — CREATININE, SERUM
Creatinine, Ser: 0.72 mg/dL (ref 0.44–1.00)
GFR calc Af Amer: >60 mL/min (ref >60)
GFR calc non Af Amer: >60 mL/min (ref >60)

## 2017-10-26 SURGERY — IRRIGATION AND DEBRIDEMENT EXTREMITY
Anesthesia: General | Site: Thigh | Laterality: Left

## 2017-10-26 MED ORDER — LIDOCAINE HCL (CARDIAC) PF 100 MG/5ML IV SOSY
PREFILLED_SYRINGE | INTRAVENOUS | Status: DC | PRN
  Administered 2017-10-26: 100 mg via INTRAVENOUS

## 2017-10-26 MED ORDER — SUCCINYLCHOLINE CHLORIDE 20 MG/ML IJ SOLN
INTRAMUSCULAR | Status: DC | PRN
  Administered 2017-10-26: 80 mg via INTRAVENOUS

## 2017-10-26 MED ORDER — ACETAMINOPHEN 325 MG PO TABS: 325.0000 mg | ORAL_TABLET | Freq: Four times a day (QID) | ORAL | Status: DC | PRN

## 2017-10-26 MED ORDER — PHENYLEPHRINE 40 MCG/ML (10ML) SYRINGE FOR IV PUSH (FOR BLOOD PRESSURE SUPPORT)
PREFILLED_SYRINGE | INTRAVENOUS | Status: AC
  Filled 2017-10-26: qty 30

## 2017-10-26 MED ORDER — PROMETHAZINE HCL 25 MG/ML IJ SOLN
6.2500 mg | INTRAMUSCULAR | Status: DC | PRN
Start: 2017-10-26 — End: 2017-10-26

## 2017-10-26 MED ORDER — METHOCARBAMOL 500 MG PO TABS
500.0000 mg | ORAL_TABLET | Freq: Four times a day (QID) | ORAL | Status: DC | PRN
  Administered 2017-10-26: 500 mg via ORAL
  Filled 2017-10-26: qty 1

## 2017-10-26 MED ORDER — 0.9 % SODIUM CHLORIDE (POUR BTL) OPTIME
TOPICAL | Status: DC | PRN
  Administered 2017-10-26: 1000 mL

## 2017-10-26 MED ORDER — DEXAMETHASONE SODIUM PHOSPHATE 10 MG/ML IJ SOLN
INTRAMUSCULAR | Status: AC
  Filled 2017-10-26: qty 2

## 2017-10-26 MED ORDER — SUGAMMADEX SODIUM 500 MG/5ML IV SOLN
INTRAVENOUS | Status: AC
  Filled 2017-10-26: qty 5

## 2017-10-26 MED ORDER — PROPOFOL 10 MG/ML IV BOLUS
INTRAVENOUS | Status: AC
  Filled 2017-10-26: qty 20

## 2017-10-26 MED ORDER — PROPOFOL 10 MG/ML IV BOLUS
INTRAVENOUS | Status: DC | PRN
  Administered 2017-10-26: 150 mg via INTRAVENOUS

## 2017-10-26 MED ORDER — METOCLOPRAMIDE HCL 5 MG/ML IJ SOLN: 5.0000 mg | Freq: Three times a day (TID) | INTRAMUSCULAR | Status: DC | PRN

## 2017-10-26 MED ORDER — MAGNESIUM CITRATE PO SOLN: 1.0000 | Freq: Once | ORAL | Status: DC | PRN

## 2017-10-26 MED ORDER — SODIUM CHLORIDE 0.9 % IR SOLN
Status: DC | PRN
  Administered 2017-10-26: 1000 mL

## 2017-10-26 MED ORDER — CEFAZOLIN SODIUM-DEXTROSE 2-4 GM/100ML-% IV SOLN
2.0000 g | INTRAVENOUS | Status: AC
  Administered 2017-10-26: 2 g via INTRAVENOUS
  Filled 2017-10-26 (×2): qty 100

## 2017-10-26 MED ORDER — METOCLOPRAMIDE HCL 5 MG PO TABS: 5.0000 mg | ORAL_TABLET | Freq: Three times a day (TID) | ORAL | Status: DC | PRN

## 2017-10-26 MED ORDER — OXYCODONE HCL 5 MG PO TABS: 5.0000 mg | ORAL_TABLET | Freq: Four times a day (QID) | ORAL | Status: DC | PRN

## 2017-10-26 MED ORDER — ROCURONIUM BROMIDE 100 MG/10ML IV SOLN
INTRAVENOUS | Status: DC | PRN
  Administered 2017-10-26: 30 mg via INTRAVENOUS

## 2017-10-26 MED ORDER — FENTANYL CITRATE (PF) 100 MCG/2ML IJ SOLN
INTRAMUSCULAR | Status: AC
  Administered 2017-10-26: 50 ug via INTRAVENOUS
  Filled 2017-10-26: qty 2

## 2017-10-26 MED ORDER — DEXAMETHASONE SODIUM PHOSPHATE 4 MG/ML IJ SOLN
INTRAMUSCULAR | Status: DC | PRN
  Administered 2017-10-26: 10 mg via INTRAVENOUS

## 2017-10-26 MED ORDER — METHOCARBAMOL 1000 MG/10ML IJ SOLN
500.0000 mg | Freq: Four times a day (QID) | INTRAVENOUS | Status: DC | PRN
  Filled 2017-10-26: qty 5

## 2017-10-26 MED ORDER — HYDROCODONE-ACETAMINOPHEN 7.5-325 MG PO TABS
1.0000 | ORAL_TABLET | ORAL | Status: DC | PRN
  Administered 2017-10-26 – 2017-10-27 (×2): 2 via ORAL
  Filled 2017-10-26 (×2): qty 2

## 2017-10-26 MED ORDER — SODIUM CHLORIDE 0.9 % IV SOLN
INTRAVENOUS | Status: DC
  Administered 2017-10-26: 10 mL/h via INTRAVENOUS

## 2017-10-26 MED ORDER — HYDROCODONE-ACETAMINOPHEN 5-325 MG PO TABS
1.0000 | ORAL_TABLET | ORAL | Status: DC | PRN
  Administered 2017-10-27 – 2017-10-28 (×2): 2 via ORAL
  Filled 2017-10-26 (×2): qty 2

## 2017-10-26 MED ORDER — FENTANYL CITRATE (PF) 100 MCG/2ML IJ SOLN
INTRAMUSCULAR | Status: DC | PRN
  Administered 2017-10-26: 50 ug via INTRAVENOUS
  Administered 2017-10-26: 100 ug via INTRAVENOUS

## 2017-10-26 MED ORDER — PHENYLEPHRINE HCL 10 MG/ML IJ SOLN
INTRAMUSCULAR | Status: DC | PRN
  Administered 2017-10-26: 160 ug via INTRAVENOUS
  Administered 2017-10-26 (×5): 80 ug via INTRAVENOUS

## 2017-10-26 MED ORDER — LACTATED RINGERS IV SOLN
INTRAVENOUS | Status: DC
  Administered 2017-10-26: 15:00:00 via INTRAVENOUS

## 2017-10-26 MED ORDER — ONDANSETRON HCL 4 MG/2ML IJ SOLN
INTRAMUSCULAR | Status: DC | PRN
  Administered 2017-10-26: 4 mg via INTRAVENOUS

## 2017-10-26 MED ORDER — LIDOCAINE 2% (20 MG/ML) 5 ML SYRINGE
INTRAMUSCULAR | Status: AC
  Filled 2017-10-26: qty 15

## 2017-10-26 MED ORDER — DOCUSATE SODIUM 100 MG PO CAPS
100.0000 mg | ORAL_CAPSULE | Freq: Two times a day (BID) | ORAL | Status: DC
  Administered 2017-10-26 – 2017-10-28 (×4): 100 mg via ORAL
  Filled 2017-10-26 (×4): qty 1

## 2017-10-26 MED ORDER — BISACODYL 10 MG RE SUPP: 10.0000 mg | Freq: Every day | RECTAL | Status: DC | PRN

## 2017-10-26 MED ORDER — ONDANSETRON HCL 4 MG/2ML IJ SOLN
INTRAMUSCULAR | Status: AC
  Filled 2017-10-26: qty 6

## 2017-10-26 MED ORDER — FENTANYL CITRATE (PF) 250 MCG/5ML IJ SOLN
INTRAMUSCULAR | Status: AC
  Filled 2017-10-26: qty 5

## 2017-10-26 MED ORDER — CHLORHEXIDINE GLUCONATE 4 % EX LIQD
60.0000 mL | Freq: Once | CUTANEOUS | Status: DC
  Filled 2017-10-26: qty 60

## 2017-10-26 MED ORDER — MEPERIDINE HCL 50 MG/ML IJ SOLN: 6.2500 mg | INTRAMUSCULAR | Status: DC | PRN

## 2017-10-26 MED ORDER — FENTANYL CITRATE (PF) 100 MCG/2ML IJ SOLN
25.0000 ug | INTRAMUSCULAR | Status: DC | PRN
  Administered 2017-10-26 (×2): 50 ug via INTRAVENOUS

## 2017-10-26 MED ORDER — SUGAMMADEX SODIUM 200 MG/2ML IV SOLN
INTRAVENOUS | Status: DC | PRN
  Administered 2017-10-26: 450 mg via INTRAVENOUS

## 2017-10-26 MED ORDER — ROCURONIUM BROMIDE 10 MG/ML (PF) SYRINGE
PREFILLED_SYRINGE | INTRAVENOUS | Status: AC
  Filled 2017-10-26: qty 5

## 2017-10-26 MED ORDER — ONDANSETRON HCL 4 MG PO TABS: 4.0000 mg | ORAL_TABLET | Freq: Four times a day (QID) | ORAL | Status: DC | PRN

## 2017-10-26 MED ORDER — MORPHINE SULFATE (PF) 4 MG/ML IV SOLN: 0.5000 mg | INTRAVENOUS | Status: DC | PRN

## 2017-10-26 MED ORDER — MORPHINE SULFATE (PF) 4 MG/ML IV SOLN: 2.0000 mg | INTRAVENOUS | Status: DC | PRN

## 2017-10-26 MED ORDER — POLYETHYLENE GLYCOL 3350 17 G PO PACK: 17.0000 g | PACK | Freq: Every day | ORAL | Status: DC | PRN

## 2017-10-26 MED ORDER — ONDANSETRON HCL 4 MG/2ML IJ SOLN
4.0000 mg | Freq: Four times a day (QID) | INTRAMUSCULAR | Status: DC | PRN
  Administered 2017-10-26: 4 mg via INTRAVENOUS
  Filled 2017-10-26: qty 2

## 2017-10-26 MED ORDER — SUCCINYLCHOLINE CHLORIDE 200 MG/10ML IV SOSY
PREFILLED_SYRINGE | INTRAVENOUS | Status: AC
  Filled 2017-10-26: qty 20

## 2017-10-26 MED ORDER — HEPARIN SODIUM (PORCINE) 5000 UNIT/ML IJ SOLN
5000.0000 [IU] | Freq: Three times a day (TID) | INTRAMUSCULAR | Status: DC
  Administered 2017-10-27 – 2017-10-31 (×11): 5000 [IU] via SUBCUTANEOUS
  Filled 2017-10-26 (×12): qty 1

## 2017-10-26 MED ORDER — DEXMEDETOMIDINE HCL IN NACL 200 MCG/50ML IV SOLN
INTRAVENOUS | Status: AC
  Filled 2017-10-26: qty 50

## 2017-10-26 SURGICAL SUPPLY — 33 items
BENZOIN TINCTURE PRP APPL 2/3 (GAUZE/BANDAGES/DRESSINGS) ×3 IMPLANT
BLADE SURG 21 STRL SS (BLADE) ×3 IMPLANT
BNDG COHESIVE 6X5 TAN STRL LF (GAUZE/BANDAGES/DRESSINGS) ×3 IMPLANT
CASSETTE VERAFLO VERALINK (MISCELLANEOUS) ×3 IMPLANT
COVER SURGICAL LIGHT HANDLE (MISCELLANEOUS) ×6 IMPLANT
DRAPE INCISE IOBAN 66X45 STRL (DRAPES) ×3 IMPLANT
DRAPE U-SHAPE 47X51 STRL (DRAPES) ×3 IMPLANT
DRESSING VERAFLO CLEANSE CC (GAUZE/BANDAGES/DRESSINGS) ×1 IMPLANT
DRSG VERAFLO CLEANSE CC (GAUZE/BANDAGES/DRESSINGS) ×3
DURAPREP 26ML APPLICATOR (WOUND CARE) ×3 IMPLANT
ELECT REM PT RETURN 9FT ADLT (ELECTROSURGICAL)
ELECTRODE REM PT RTRN 9FT ADLT (ELECTROSURGICAL) IMPLANT
GLOVE BIOGEL PI IND STRL 9 (GLOVE) ×1 IMPLANT
GLOVE BIOGEL PI INDICATOR 9 (GLOVE) ×2
GLOVE SURG ORTHO 9.0 STRL STRW (GLOVE) ×3 IMPLANT
GOWN STRL REUS W/ TWL XL LVL3 (GOWN DISPOSABLE) ×2 IMPLANT
GOWN STRL REUS W/TWL XL LVL3 (GOWN DISPOSABLE) ×4
KIT BASIN OR (CUSTOM PROCEDURE TRAY) ×3 IMPLANT
KIT TURNOVER KIT B (KITS) ×3 IMPLANT
NS IRRIG 1000ML POUR BTL (IV SOLUTION) ×3 IMPLANT
PACK ORTHO EXTREMITY (CUSTOM PROCEDURE TRAY) ×3 IMPLANT
PAD ARMBOARD 7.5X6 YLW CONV (MISCELLANEOUS) ×3 IMPLANT
SPONGE LAP 18X18 RF (DISPOSABLE) ×3 IMPLANT
STOCKINETTE IMPERVIOUS 9X36 MD (GAUZE/BANDAGES/DRESSINGS) ×3 IMPLANT
SUT ETHILON 2 0 PSLX (SUTURE) ×9 IMPLANT
SUT SILK 2 0 (SUTURE) ×2
SUT SILK 2-0 18XBRD TIE 12 (SUTURE) ×1 IMPLANT
SWAB COLLECTION DEVICE MRSA (MISCELLANEOUS) ×3 IMPLANT
SWAB CULTURE ESWAB REG 1ML (MISCELLANEOUS) ×3 IMPLANT
TOWEL OR 17X26 10 PK STRL BLUE (TOWEL DISPOSABLE) ×3 IMPLANT
TUBE CONNECTING 12'X1/4 (SUCTIONS) ×1
TUBE CONNECTING 12X1/4 (SUCTIONS) ×2 IMPLANT
YANKAUER SUCT BULB TIP NO VENT (SUCTIONS) ×3 IMPLANT

## 2017-10-26 NOTE — Op Note (Addendum)
10/26/2017  4:58 PM  PATIENT:  Melissa Forbes    PRE-OPERATIVE DIAGNOSIS:  Abscess Left Thigh  POST-OPERATIVE DIAGNOSIS:  Same  PROCEDURE:  DEBRIDEMENT LEFT THIGH ABSCESS, APPLY WOUND VAC Debridement left calf ulcer with local tissue rearrangement for wound closure 4 x 7 cm.  SURGEON:  Nadara MustardMarcus V Duda, MD  PHYSICIAN ASSISTANT:None ANESTHESIA:   General  PREOPERATIVE INDICATIONS:  Melissa Forbes is a  67 y.o. female with a diagnosis of Abscess Left Thigh who failed conservative measures and elected for surgical management.    The risks benefits and alternatives were discussed with the patient preoperatively including but not limited to the risks of infection, bleeding, nerve injury, cardiopulmonary complications, the need for revision surgery, among others, and the patient was willing to proceed.  OPERATIVE IMPLANTS: Instillation wound VAC reticulated foam  @ENCIMAGES @  OPERATIVE FINDINGS: Deep necrotic fat tissue with purulent appearing drainage. Tissue sent for cultures and aerobic and anaerobic swabs sent for cultures.  OPERATIVE PROCEDURE: Patient was brought to the operating room and underwent a general anesthetic.  After adequate levels of anesthesia were obtained patient's left lower extremity was prepped using DuraPrep draped in the sterile field a timeout was called.  A football shaped elliptical incision was made around the medial thigh ulcer this was approximately 3 x 7 cm.  This was carried down to necrotic fat tissue.  Cultures were obtained x2 the necrotic fat was also sent for cultures.  A curette was used to further debride the necrotic fat and indurated fat.  This wound did not extend deep to the fascia.  The wound was approximately 3 cm deep.  The greater saphenous vein was suture ligated with 2-0 silk.  The wound was irrigated with normal saline and a installation wound VAC was applied this had a good suction fit.  The ulcer over the tibia were also ellipsed out these  were cleansed of tissue there is no necrotic fat no necrotic tissue no deep abscess.  Local tissue rearrangement was used to close the tibial wounds which were 4 x 7 cm.  A compressive wrap was applied from the metatarsal heads to the mid thigh.  The instillation wound VAC was set for 10 cc 10-minute dwell time to our suction.  Patient was extubated in the PACU in stable condition.   DISCHARGE PLANNING:  Antibiotic duration: Continue IV antibiotics and adjust according to tissue cultures  Weightbearing: Weightbearing on the left lower extremity for transfers no gait training  Pain medication: Pain medication ordered  Dressing care/ Wound VAC: Continue installation wound VAC therapy this will be changed on Friday  Ambulatory devices: Walker  Discharge to: Discharge to home or skilled nursing depending on patient's progress with therapy.  Follow-up: In the office 1 week post operative.
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
## 2017-10-26 NOTE — Progress Notes (Signed)
PT Cancellation Note  Patient Details Name: Wyonia HoughHattie Hofbauer MRN: [id number] DOB: 12/28/1950   Cancelled Treatment:    Reason Eval/Treat Not Completed: Patient at procedure or test/unavailable Pt currently out of room for procedure. Will follow up as schedule allows.   Gladys DammeBrittany Jeffey Janssen, PT, DPT  Acute Rehabilitation Services  Pager: [phone]  Lehman PromBrittany S Dailey Alberson 10/26/2017, 3:03 PM
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
## 2017-10-26 NOTE — Progress Notes (Signed)
[facility] transport set up for transfer to [facility].

## 2017-10-26 NOTE — Anesthesia Procedure Notes (Signed)
Procedure Name: Intubation Date/Time: 10/26/2017 4:12 PM Performed by: Tyrina Hines T, CRNA Pre-anesthesia Checklist: Patient identified, Emergency Drugs available, Suction available and Patient being monitored Patient Re-evaluated:Patient Re-evaluated prior to induction Oxygen Delivery Method: Circle system utilized Preoxygenation: Pre-oxygenation with 100% oxygen Induction Type: IV induction Ventilation: Mask ventilation without difficulty and Oral airway inserted - appropriate to patient size Laryngoscope Size: Miller and 2 Grade View: Grade I Tube size: 7.5 mm Number of attempts: 1 Airway Equipment and Method: Patient positioned with wedge pillow and Stylet Placement Confirmation: ETT inserted through vocal cords under direct vision,  positive ETCO2 and breath sounds checked- equal and bilateral Secured at: 21 cm Tube secured with: Tape Dental Injury: Teeth and Oropharynx as per pre-operative assessment

## 2017-10-26 NOTE — Transfer of Care (Signed)
Immediate Anesthesia Transfer of Care Note  Patient: Melissa Forbes  Procedure(s) Performed: DEBRIDEMENT LEFT THIGH ABSCESS, APPLY WOUND VAC (Left )  Patient Location: PACU  Anesthesia Type:General  Level of Consciousness: awake, alert  and oriented  Airway & Oxygen Therapy: Patient Spontanous Breathing and Patient connected to nasal cannula oxygen  Post-op Assessment: Report given to RN, Post -op Vital signs reviewed and stable and Patient moving all extremities  Post vital signs: Reviewed and stable  Last Vitals:  Vitals Value Taken Time  BP 121/59 10/26/2017  5:13 PM  Temp    Pulse 102 10/26/2017  5:19 PM  Resp 14 10/26/2017  5:19 PM  SpO2 100 % 10/26/2017  5:19 PM  Vitals shown include unvalidated device data.  Last Pain:  Vitals:   10/26/17 1715  TempSrc:   PainSc: (P) 9       Patients Stated Pain Goal: (P) 2 (10/26/17 1715)  Complications: No apparent anesthesia complications

## 2017-10-26 NOTE — Anesthesia Preprocedure Evaluation (Signed)
Anesthesia Evaluation  Patient identified by MRN, date of birth, ID band Patient awake    Reviewed: Allergy & Precautions, NPO status , Patient's Chart, lab work & pertinent test results  Airway Mallampati: III  TM Distance: >3 FB Neck ROM: Full    Dental no notable dental hx.    Pulmonary neg pulmonary ROS,    Pulmonary exam normal breath sounds clear to auscultation       Cardiovascular hypertension, Normal cardiovascular exam Rhythm:Regular Rate:Normal     Neuro/Psych negative neurological ROS  negative psych ROS   GI/Hepatic negative GI ROS, Neg liver ROS,   Endo/Other  Morbid obesity  Renal/GU negative Renal ROS     Musculoskeletal negative musculoskeletal ROS (+)   Abdominal (+) + obese,   Peds  Hematology negative hematology ROS (+)   Anesthesia Other Findings   Reproductive/Obstetrics negative OB ROS                             Anesthesia Physical Anesthesia Plan  ASA: III  Anesthesia Plan: General   Post-op Pain Management:    Induction: Intravenous  PONV Risk Score and Plan: 4 or greater and Ondansetron, Dexamethasone, Midazolam and Treatment may vary due to age or medical condition  Airway Management Planned: LMA  Additional Equipment:   Intra-op Plan:   Post-operative Plan: Extubation in OR  Informed Consent: I have reviewed the patients History and Physical, chart, labs and discussed the procedure including the risks, benefits and alternatives for the proposed anesthesia with the patient or authorized representative who has indicated his/her understanding and acceptance.   Dental advisory given  Plan Discussed with: CRNA  Anesthesia Plan Comments:         Anesthesia Quick Evaluation

## 2017-10-26 NOTE — Progress Notes (Signed)
Report called to [place] at [facility].

## 2017-10-27 ENCOUNTER — Encounter (HOSPITAL_COMMUNITY): Payer: Self-pay | Admitting: Orthopedic Surgery

## 2017-10-27 ENCOUNTER — Other Ambulatory Visit (INDEPENDENT_AMBULATORY_CARE_PROVIDER_SITE_OTHER): Payer: Self-pay | Admitting: Orthopedic Surgery

## 2017-10-27 DIAGNOSIS — L02416 Cutaneous abscess of left lower limb: Secondary | ICD-10-CM

## 2017-10-27 DIAGNOSIS — D500 Iron deficiency anemia secondary to blood loss (chronic): Secondary | ICD-10-CM

## 2017-10-27 LAB — CBC
HCT: 26 % — ABNORMAL LOW (ref 36.0–46.0)
HEMOGLOBIN: 7.7 g/dL — AB (ref 12.0–15.0)
MCH: 26.2 pg (ref 26.0–34.0)
MCHC: 29.6 g/dL — ABNORMAL LOW (ref 30.0–36.0)
MCV: 88.4 fL (ref 78.0–100.0)
PLATELETS: 489 10*3/uL — AB (ref 150–400)
RBC: 2.94 MIL/uL — AB (ref 3.87–5.11)
RDW: 20.4 % — ABNORMAL HIGH (ref 11.5–15.5)
WBC: 12.3 10*3/uL — AB (ref 4.0–10.5)

## 2017-10-27 LAB — MAGNESIUM: MAGNESIUM: 1.7 mg/dL (ref 1.7–2.4)

## 2017-10-27 LAB — BASIC METABOLIC PANEL
ANION GAP: 10 (ref 5–15)
BUN: 17 mg/dL (ref 6–20)
CALCIUM: 9.3 mg/dL (ref 8.9–10.3)
CO2: 26 mmol/L (ref 22–32)
CREATININE: 0.94 mg/dL (ref 0.44–1.00)
Chloride: 101 mmol/L (ref 101–111)
Glucose, Bld: 137 mg/dL — ABNORMAL HIGH (ref 65–99)
Potassium: 4.8 mmol/L (ref 3.5–5.1)
SODIUM: 137 mmol/L (ref 135–145)

## 2017-10-27 LAB — FERRITIN: Ferritin: 138 ng/mL (ref 11–307)

## 2017-10-27 LAB — IRON AND TIBC
IRON: 31 ug/dL (ref 28–170)
SATURATION RATIOS: 13 % (ref 10.4–31.8)
TIBC: 238 ug/dL — ABNORMAL LOW (ref 250–450)
UIBC: 207 ug/dL

## 2017-10-27 LAB — VITAMIN B12: VITAMIN B 12: 400 pg/mL (ref 180–914)

## 2017-10-27 MED ORDER — CEFAZOLIN SODIUM-DEXTROSE 2-4 GM/100ML-% IV SOLN
2.0000 g | INTRAVENOUS | Status: DC
  Filled 2017-10-27: qty 100
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
## 2017-10-27 NOTE — Progress Notes (Signed)
Patient ID: Melissa Forbes, female   DOB: 06/12/1951, 67 y.o.   MRN: [id number] Postoperative day 1 debridement of 2 large abscesses left thigh and left calf.  Patient's installation wound VAC is functioning well.  Plan to return to the operating room on Friday for reevaluation.  If the wound is clean may be able to close the wound primarily.  Otherwise we will plan to return to the operating room on next Wednesday. 

## 2017-10-27 NOTE — Social Work (Signed)
CSW acknowledging consult for SNF placement, await PT/OT recommendations post surgical intervention.  Melissa HutchingIsabel H Zsofia Forbes, LCSWA [facility] Clinical Social Work [phone]

## 2017-10-27 NOTE — Anesthesia Postprocedure Evaluation (Signed)
Anesthesia Post Note  Patient: Melissa HoughHattie Forbes  Procedure(s) Performed: LEFT THIGH ABSCESS INCISION AND DRAINAGE, IRRIGATION AND DEBRIDEMENT (Left Thigh) APPLICATION OF WOUND VAC (Left Thigh)     Patient location during evaluation: PACU Anesthesia Type: General Level of consciousness: awake and alert Pain management: pain level controlled Vital Signs Assessment: post-procedure vital signs reviewed and stable Respiratory status: spontaneous breathing, nonlabored ventilation, respiratory function stable and patient connected to nasal cannula oxygen Cardiovascular status: blood pressure returned to baseline and stable Postop Assessment: no apparent nausea or vomiting Anesthetic complications: no    Last Vitals:  Vitals:   10/27/17 0533 10/27/17 1352  BP: 130/70 (!) 115/50  Pulse: 88 94  Resp:  16  Temp: 36.9 C 36.6 C  SpO2: 100% 97%    Last Pain:  Vitals:   10/27/17 1352  TempSrc: Oral  PainSc:                  [name] COKER
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
## 2017-10-27 NOTE — H&P (View-Only) (Signed)
Patient ID: Wyonia HoughHattie Barcelona, female   DOB: September 28, 1950, 67 y.o.   MRN: [id number] Postoperative day 1 debridement of 2 large abscesses left thigh and left calf.  Patient's installation wound VAC is functioning well.  Plan to return to the operating room on Friday for reevaluation.  If the wound is clean may be able to close the wound primarily.  Otherwise we will plan to return to the operating room on next Wednesday.
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
## 2017-10-28 ENCOUNTER — Encounter (HOSPITAL_COMMUNITY)
Admission: AD | Disposition: A | Discharge status: Home Health | Payer: Self-pay | Source: Ambulatory Visit | Attending: Internal Medicine

## 2017-10-28 ENCOUNTER — Inpatient Hospital Stay (HOSPITAL_COMMUNITY): Payer: BLUE CROSS/BLUE SHIELD | Admitting: Anesthesiology

## 2017-10-28 DIAGNOSIS — L02416 Cutaneous abscess of left lower limb: Secondary | ICD-10-CM

## 2017-10-28 HISTORY — PX: I & D EXTREMITY: SHX5045

## 2017-10-28 LAB — FOLATE RBC
FOLATE, RBC: 1480 ng/mL (ref >498)
Folate, Hemolysate: 386.3 ng/mL
HEMATOCRIT: 26.1 % — AB (ref 34.0–46.6)

## 2017-10-28 LAB — CBC
HEMATOCRIT: 21.8 % — AB (ref 36.0–46.0)
Hemoglobin: 6.5 g/dL — CL (ref 12.0–15.0)
MCH: 26.7 pg (ref 26.0–34.0)
MCHC: 29.8 g/dL — ABNORMAL LOW (ref 30.0–36.0)
MCV: 89.7 fL (ref 78.0–100.0)
PLATELETS: 421 10*3/uL — AB (ref 150–400)
RBC: 2.43 MIL/uL — AB (ref 3.87–5.11)
RDW: 21 % — ABNORMAL HIGH (ref 11.5–15.5)
WBC: 13.6 10*3/uL — ABNORMAL HIGH (ref 4.0–10.5)

## 2017-10-28 LAB — MAGNESIUM: Magnesium: 1.8 mg/dL (ref 1.7–2.4)

## 2017-10-28 LAB — BASIC METABOLIC PANEL
Anion gap: 7 (ref 5–15)
BUN: 19 mg/dL (ref 6–20)
CALCIUM: 8.6 mg/dL — AB (ref 8.9–10.3)
CO2: 28 mmol/L (ref 22–32)
CREATININE: 0.96 mg/dL (ref 0.44–1.00)
Chloride: 105 mmol/L (ref 101–111)
GFR calc Af Amer: >60 mL/min (ref >60)
Glucose, Bld: 91 mg/dL (ref 65–99)
Potassium: 4 mmol/L (ref 3.5–5.1)
Sodium: 140 mmol/L (ref 135–145)

## 2017-10-28 LAB — PREPARE RBC (CROSSMATCH)

## 2017-10-28 LAB — ABO/RH: ABO/RH(D): O POS

## 2017-10-28 SURGERY — IRRIGATION AND DEBRIDEMENT EXTREMITY
Anesthesia: General | Site: Leg Upper | Laterality: Left

## 2017-10-28 MED ORDER — HYDROCODONE-ACETAMINOPHEN 7.5-325 MG PO TABS: 1.0000 | ORAL_TABLET | ORAL | Status: DC | PRN

## 2017-10-28 MED ORDER — BISACODYL 10 MG RE SUPP: 10.0000 mg | Freq: Every day | RECTAL | Status: DC | PRN

## 2017-10-28 MED ORDER — ONDANSETRON HCL 4 MG PO TABS: 4.0000 mg | ORAL_TABLET | Freq: Four times a day (QID) | ORAL | Status: DC | PRN

## 2017-10-28 MED ORDER — PROMETHAZINE HCL 25 MG/ML IJ SOLN: 6.2500 mg | INTRAMUSCULAR | Status: DC | PRN

## 2017-10-28 MED ORDER — MAGNESIUM CITRATE PO SOLN: 1.0000 | Freq: Once | ORAL | Status: DC | PRN

## 2017-10-28 MED ORDER — METHOCARBAMOL 1000 MG/10ML IJ SOLN
500.0000 mg | Freq: Four times a day (QID) | INTRAVENOUS | Status: DC | PRN
  Filled 2017-10-28: qty 5

## 2017-10-28 MED ORDER — FENTANYL CITRATE (PF) 250 MCG/5ML IJ SOLN
INTRAMUSCULAR | Status: AC
  Filled 2017-10-28: qty 5

## 2017-10-28 MED ORDER — MEPERIDINE HCL 25 MG/ML IJ SOLN: 6.2500 mg | INTRAMUSCULAR | Status: DC | PRN

## 2017-10-28 MED ORDER — SODIUM CHLORIDE 0.9 % IR SOLN
Status: DC | PRN
  Administered 2017-10-28: 3000 mL

## 2017-10-28 MED ORDER — DOCUSATE SODIUM 100 MG PO CAPS
100.0000 mg | ORAL_CAPSULE | Freq: Two times a day (BID) | ORAL | Status: DC
  Administered 2017-10-28 – 2017-10-31 (×4): 100 mg via ORAL
  Filled 2017-10-28 (×5): qty 1

## 2017-10-28 MED ORDER — OXYCODONE HCL 5 MG/5ML PO SOLN: 5.0000 mg | Freq: Once | ORAL | Status: DC | PRN

## 2017-10-28 MED ORDER — METHOCARBAMOL 500 MG PO TABS
500.0000 mg | ORAL_TABLET | Freq: Four times a day (QID) | ORAL | Status: DC | PRN
Start: 2017-10-28 — End: 2017-10-31

## 2017-10-28 MED ORDER — PROPOFOL 10 MG/ML IV BOLUS
INTRAVENOUS | Status: AC
  Filled 2017-10-28: qty 20

## 2017-10-28 MED ORDER — POLYETHYLENE GLYCOL 3350 17 G PO PACK: 17.0000 g | PACK | Freq: Every day | ORAL | Status: DC | PRN

## 2017-10-28 MED ORDER — MIDAZOLAM HCL 2 MG/2ML IJ SOLN
INTRAMUSCULAR | Status: AC
  Filled 2017-10-28: qty 2

## 2017-10-28 MED ORDER — METOCLOPRAMIDE HCL 5 MG/ML IJ SOLN: 5.0000 mg | Freq: Three times a day (TID) | INTRAMUSCULAR | Status: DC | PRN

## 2017-10-28 MED ORDER — SODIUM CHLORIDE 0.9 % IV SOLN
Freq: Once | INTRAVENOUS | Status: AC
  Administered 2017-10-28: 22:00:00 via INTRAVENOUS

## 2017-10-28 MED ORDER — PROPOFOL 10 MG/ML IV BOLUS
INTRAVENOUS | Status: DC | PRN
  Administered 2017-10-28: 200 mg via INTRAVENOUS

## 2017-10-28 MED ORDER — METOCLOPRAMIDE HCL 5 MG PO TABS: 5.0000 mg | ORAL_TABLET | Freq: Three times a day (TID) | ORAL | Status: DC | PRN

## 2017-10-28 MED ORDER — HYDROMORPHONE HCL 2 MG/ML IJ SOLN: 0.3000 mg | INTRAMUSCULAR | Status: DC | PRN

## 2017-10-28 MED ORDER — MORPHINE SULFATE (PF) 4 MG/ML IV SOLN: 0.5000 mg | INTRAVENOUS | Status: DC | PRN

## 2017-10-28 MED ORDER — HYDROCODONE-ACETAMINOPHEN 5-325 MG PO TABS
1.0000 | ORAL_TABLET | ORAL | Status: DC | PRN
  Administered 2017-10-29 (×2): 2 via ORAL
  Filled 2017-10-28 (×2): qty 2

## 2017-10-28 MED ORDER — PHENYLEPHRINE HCL 10 MG/ML IJ SOLN
INTRAMUSCULAR | Status: DC | PRN
  Administered 2017-10-28: 50 ug via INTRAVENOUS
  Administered 2017-10-28: 40 ug via INTRAVENOUS

## 2017-10-28 MED ORDER — LACTATED RINGERS IV SOLN
INTRAVENOUS | Status: DC | PRN
  Administered 2017-10-28: 07:00:00 via INTRAVENOUS

## 2017-10-28 MED ORDER — OXYCODONE HCL 5 MG PO TABS: 5.0000 mg | ORAL_TABLET | Freq: Once | ORAL | Status: DC | PRN

## 2017-10-28 MED ORDER — ONDANSETRON HCL 4 MG/2ML IJ SOLN: 4.0000 mg | Freq: Four times a day (QID) | INTRAMUSCULAR | Status: DC | PRN

## 2017-10-28 MED ORDER — SODIUM CHLORIDE 0.9 % IV SOLN
INTRAVENOUS | Status: DC
  Administered 2017-10-28: 1 mL via INTRAVENOUS

## 2017-10-28 MED ORDER — FENTANYL CITRATE (PF) 100 MCG/2ML IJ SOLN
INTRAMUSCULAR | Status: DC | PRN
  Administered 2017-10-28: 50 ug via INTRAVENOUS

## 2017-10-28 MED ORDER — ACETAMINOPHEN 325 MG PO TABS: 325.0000 mg | ORAL_TABLET | Freq: Four times a day (QID) | ORAL | Status: DC | PRN

## 2017-10-28 MED ORDER — ONDANSETRON HCL 4 MG/2ML IJ SOLN
INTRAMUSCULAR | Status: DC | PRN
  Administered 2017-10-28: 4 mg via INTRAVENOUS

## 2017-10-28 MED ORDER — LIDOCAINE HCL (CARDIAC) PF 100 MG/5ML IV SOSY
PREFILLED_SYRINGE | INTRAVENOUS | Status: DC | PRN
  Administered 2017-10-28: 100 mg via INTRAVENOUS

## 2017-10-28 MED ORDER — 0.9 % SODIUM CHLORIDE (POUR BTL) OPTIME
TOPICAL | Status: DC | PRN
  Administered 2017-10-28: 1000 mL

## 2017-10-28 MED ORDER — CHLORHEXIDINE GLUCONATE 4 % EX LIQD: 60.0000 mL | Freq: Once | CUTANEOUS | Status: DC

## 2017-10-28 SURGICAL SUPPLY — 35 items
BENZOIN TINCTURE PRP APPL 2/3 (GAUZE/BANDAGES/DRESSINGS) ×8 IMPLANT
BLADE SURG 21 STRL SS (BLADE) ×3 IMPLANT
BNDG COHESIVE 6X5 TAN STRL LF (GAUZE/BANDAGES/DRESSINGS) ×2 IMPLANT
COVER SURGICAL LIGHT HANDLE (MISCELLANEOUS) ×4 IMPLANT
DRAPE INCISE IOBAN 66X45 STRL (DRAPES) ×2 IMPLANT
DRAPE U-SHAPE 47X51 STRL (DRAPES) ×3 IMPLANT
DRESSING PREVENA PLUS CUSTOM (GAUZE/BANDAGES/DRESSINGS) IMPLANT
DRSG ADAPTIC 3X8 NADH LF (GAUZE/BANDAGES/DRESSINGS) ×1 IMPLANT
DRSG PREVENA PLUS CUSTOM (GAUZE/BANDAGES/DRESSINGS) ×3
DURAPREP 26ML APPLICATOR (WOUND CARE) ×5 IMPLANT
ELECT REM PT RETURN 9FT ADLT (ELECTROSURGICAL)
ELECTRODE REM PT RTRN 9FT ADLT (ELECTROSURGICAL) IMPLANT
GAUZE SPONGE 4X4 12PLY STRL (GAUZE/BANDAGES/DRESSINGS) ×1 IMPLANT
GLOVE BIOGEL PI IND STRL 9 (GLOVE) ×1 IMPLANT
GLOVE BIOGEL PI INDICATOR 9 (GLOVE) ×2
GLOVE SURG ORTHO 9.0 STRL STRW (GLOVE) ×3 IMPLANT
GOWN STRL REUS W/ TWL XL LVL3 (GOWN DISPOSABLE) ×2 IMPLANT
GOWN STRL REUS W/TWL XL LVL3 (GOWN DISPOSABLE) ×4
HANDPIECE INTERPULSE COAX TIP (DISPOSABLE) ×2
KIT BASIN OR (CUSTOM PROCEDURE TRAY) ×3 IMPLANT
KIT TURNOVER KIT B (KITS) ×3 IMPLANT
MANIFOLD NEPTUNE II (INSTRUMENTS) ×3 IMPLANT
NS IRRIG 1000ML POUR BTL (IV SOLUTION) ×3 IMPLANT
PACK ORTHO EXTREMITY (CUSTOM PROCEDURE TRAY) ×3 IMPLANT
PAD ARMBOARD 7.5X6 YLW CONV (MISCELLANEOUS) ×6 IMPLANT
PADDING CAST COTTON 6X4 STRL (CAST SUPPLIES) ×2 IMPLANT
SET HNDPC FAN SPRY TIP SCT (DISPOSABLE) IMPLANT
STOCKINETTE IMPERVIOUS 9X36 MD (GAUZE/BANDAGES/DRESSINGS) ×2 IMPLANT
SUT ETHILON 2 0 PSLX (SUTURE) ×4 IMPLANT
SWAB COLLECTION DEVICE MRSA (MISCELLANEOUS) ×1 IMPLANT
SWAB CULTURE ESWAB REG 1ML (MISCELLANEOUS) IMPLANT
TOWEL OR 17X26 10 PK STRL BLUE (TOWEL DISPOSABLE) ×3 IMPLANT
TUBE CONNECTING 12'X1/4 (SUCTIONS) ×1
TUBE CONNECTING 12X1/4 (SUCTIONS) ×2 IMPLANT
YANKAUER SUCT BULB TIP NO VENT (SUCTIONS) ×3 IMPLANT

## 2017-10-28 NOTE — Anesthesia Preprocedure Evaluation (Signed)
Anesthesia Evaluation  Patient identified by MRN, date of birth, ID band Patient awake    Reviewed: Allergy & Precautions, NPO status , Patient's Chart, lab work & pertinent test results  Airway Mallampati: III  TM Distance: >3 FB Neck ROM: Full    Dental no notable dental hx.    Pulmonary neg pulmonary ROS,    Pulmonary exam normal breath sounds clear to auscultation       Cardiovascular hypertension, Pt. on medications Normal cardiovascular exam Rhythm:Regular Rate:Normal     Neuro/Psych negative neurological ROS  negative psych ROS   GI/Hepatic negative GI ROS, Neg liver ROS,   Endo/Other  Morbid obesity  Renal/GU negative Renal ROS     Musculoskeletal negative musculoskeletal ROS (+)   Abdominal (+) + obese,   Peds  Hematology negative hematology ROS (+)   Anesthesia Other Findings   Reproductive/Obstetrics negative OB ROS                             Anesthesia Physical  Anesthesia Plan  ASA: III  Anesthesia Plan: General   Post-op Pain Management:    Induction: Intravenous  PONV Risk Score and Plan: 3 and Ondansetron, Dexamethasone, Midazolam and Treatment may vary due to age or medical condition  Airway Management Planned: LMA  Additional Equipment:   Intra-op Plan:   Post-operative Plan: Extubation in OR  Informed Consent: I have reviewed the patients History and Physical, chart, labs and discussed the procedure including the risks, benefits and alternatives for the proposed anesthesia with the patient or authorized representative who has indicated his/her understanding and acceptance.   Dental advisory given  Plan Discussed with: CRNA  Anesthesia Plan Comments:         Anesthesia Quick Evaluation

## 2017-10-28 NOTE — Transfer of Care (Signed)
Immediate Anesthesia Transfer of Care Note  Patient: Melissa Forbes  Procedure(s) Performed: REPEAT DEBRIDEMENT LEFT THIGH ABSCESS (Left Leg Upper)  Patient Location: PACU  Anesthesia Type:General  Level of Consciousness: awake and alert   Airway & Oxygen Therapy: Patient Spontanous Breathing  Post-op Assessment: Report given to RN, Post -op Vital signs reviewed and stable and Patient moving all extremities  Post vital signs: Reviewed and stable  Last Vitals:  Vitals Value Taken Time  BP 168/87 10/28/2017  8:01 AM  Temp    Pulse 94 10/28/2017  8:00 AM  Resp 15 10/28/2017  8:00 AM  SpO2 100 % 10/28/2017  8:00 AM  Vitals shown include unvalidated device data.  Last Pain:  Vitals:   10/28/17 0539  TempSrc: Oral  PainSc:       Patients Stated Pain Goal: 2 (10/27/17 0848)  Complications: No apparent anesthesia complications

## 2017-10-28 NOTE — Progress Notes (Signed)
Dr duda aware of pts hgb 6.5

## 2017-10-28 NOTE — Op Note (Signed)
10/28/2017  7:56 AM  PATIENT:  Melissa Forbes    PRE-OPERATIVE DIAGNOSIS:  Abscess Left Thigh and left leg  POST-OPERATIVE DIAGNOSIS:  Same  PROCEDURE:  REPEAT DEBRIDEMENT LEFT THIGH ABSCESS and left leg abscess Local tissue rearrangement for wound closure 10 by 4 cm,   application of wound VAC.   SURGEON:  Nadara MustardMarcus V Dreonna Hussein, MD  PHYSICIAN ASSISTANT:None ANESTHESIA:   General  PREOPERATIVE INDICATIONS:  Melissa HoughHattie Gaulin is a  67 y.o. female with a diagnosis of Abscess Left Thigh who failed conservative measures and elected for surgical management.    The risks benefits and alternatives were discussed with the patient preoperatively including but not limited to the risks of infection, bleeding, nerve injury, cardiopulmonary complications, the need for revision surgery, among others, and the patient was willing to proceed.  OPERATIVE IMPLANTS: Praveena wound VAC.  @ENCIMAGES @  OPERATIVE FINDINGS: Healthy viable tissue no abscess no necrotic tissue.  OPERATIVE PROCEDURE: Patient was brought to the operating room and underwent a general anesthetic.  After adequate level of anesthesia were obtained patient's left lower extremity was prepped using DuraPrep draped in the sterile field a timeout was called.  The wounds were open the sponges were removed and a rondure and 21 blade knife were used to debride skin soft tissue fascia and adipose tissue as well as scar tissue.  The pulsatile lavage was used to irrigate and cleanse both wounds.  Total wound surface area 10 x 4 cm.  Electrocautery was used for hemostasis.  There was good healthy bleeding granulation tissue.  Local tissue rearrangement was used to close the combined wounds of 10 x 4 cm.  An incisional Praveena wound VAC was applied to this had a good suction fit.  Patient underwent compression wrap from the metatarsal heads to the mid thigh with web roll and Covan.  Patient was extubated taken to the PACU in stable condition.   DISCHARGE  PLANNING:  Antibiotic duration: Continue antibiotics for at least 72 hours postoperatively.  Weightbearing: Weightbearing as tolerated left lower extremity.  Pain medication: Low-dose ordered.  Dressing care/ Wound VAC: Continue wound VAC for 1 week.  Ambulatory devices: Walker  Discharge to: Home or skilled nursing depending on progress with therapy.  Follow-up: In the office 1 week post operative.
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
## 2017-10-28 NOTE — Social Work (Signed)
CSW acknowledging PT/OT recommendations for home health, RN Case Manager to arrange. If recommendations change please reconsult.  CSW signing off. Please consult if any additional needs arise.  Doy HutchingIsabel H Ashla Murph, LCSWA [facility] Clinical Social Work [phone]

## 2017-10-28 NOTE — Anesthesia Postprocedure Evaluation (Signed)
Anesthesia Post Note  Patient: Melissa Forbes  Procedure(s) Performed: REPEAT DEBRIDEMENT LEFT THIGH ABSCESS (Left Leg Upper)     Patient location during evaluation: PACU Anesthesia Type: General Level of consciousness: awake and alert Pain management: pain level controlled Vital Signs Assessment: post-procedure vital signs reviewed and stable Respiratory status: spontaneous breathing, nonlabored ventilation and respiratory function stable Cardiovascular status: blood pressure returned to baseline and stable Postop Assessment: no apparent nausea or vomiting Anesthetic complications: no    Last Vitals:  Vitals:   10/28/17 0830 10/28/17 0845  BP: (!) 175/74 (!) 166/72  Pulse:  89  Resp:  18  Temp:  36.5 C  SpO2:  93%    Last Pain:  Vitals:   10/28/17 0845  TempSrc:   PainSc: 0-No pain                 Lowella CurbWarren Ray Phelix Fudala

## 2017-10-28 NOTE — Progress Notes (Signed)
CRITICAL VALUE ALERT  Critical Value:  hgb 6.5  Date & Time Notied:  10/28/2017 at 0741 Provider Notified: Dr. Lajoyce Cornersuda  Orders Received/Actions taken: pt currently in OR for wound debridement, no new order made at this time, I spoke to [name]

## 2017-10-28 NOTE — Interval H&P Note (Signed)
History and Physical Interval Note:  10/28/2017 6:31 AM  Melissa Forbes  has presented today for surgery, with the diagnosis of Abscess Left Thigh  The various methods of treatment have been discussed with the patient and family. After consideration of risks, benefits and other options for treatment, the patient has consented to  Procedure(s): REPEAT DEBRIDEMENT LEFT THIGH ABSCESS (Left) as a surgical intervention .  The patient's history has been reviewed, patient examined, no change in status, stable for surgery.  I have reviewed the patient's chart and labs.  Questions were answered to the patient's satisfaction.     Nadara MustardMarcus V Damira Kem

## 2017-10-28 NOTE — Progress Notes (Signed)
Patient ID: Melissa Forbes, female   DOB: 1951/01/22, 67 y.o.   MRN: [id number]  PROGRESS NOTE    Melissa Forbes  WUJ:[id number] DOB: September 25, 1950 DOA: 10/25/2017  PCP: Myles Lipps, MD   Brief Narrative:  66 year old female who presented to [facility] for left leg abscess and is status post I&D 4/25.  Assessment & Plan:   Active Problems:   Abscess of left thigh - Debridement left thigh abscess and leg abscess 4/26 doen by Dr. Lajoyce Corners - Continue pain management efforts - Continue vanco and zosyn     Acute blood loss anemia - ?Postoperative - Transfuse 1 U PRBC today - Follow up CBC in am   DVT prophylaxis: Heparin subQ Code Status: full code  Family Communication: no family at the bedside Disposition Plan: home once cleared by ortho   Consultants:   Ortho, Dr. Lajoyce Corners   Procedures:   Debridement left thigh abscess and leg abscess 4/26  Antimicrobials:   Vanco and zosyn 4/23 -->   Subjective: No overnight events.  Objective: Vitals:   10/28/17 0800 10/28/17 0830 10/28/17 0845 10/28/17 1436  BP: (!) 168/87 (!) 175/74 (!) 166/72 (!) 149/74  Pulse: 95  89 99  Resp: 16  18 (!) 21  Temp: 97.7 F (36.5 C)  97.7 F (36.5 C) 98.6 F (37 C)  TempSrc:    Oral  SpO2: 95%  93% 93%  Weight:      Height:        Intake/Output Summary (Last 24 hours) at 10/28/2017 1837 Last data filed at 10/28/2017 1436 Gross per 24 hour  Intake 945 ml  Output 750 ml  Net 195 ml   Filed Weights   10/25/17 1425 10/26/17 1101  Weight: 106 kg (233 lb 11.2 oz) 106.3 kg (234 lb 5.6 oz)    Examination:  General exam: Appears calm and comfortable  Respiratory system: Clear to auscultation. Respiratory effort normal. Cardiovascular system: S1 & S2 heard, RRR.  Gastrointestinal system: Abdomen is nondistended, soft and nontender. No organomegaly or masses felt. Normal bowel sounds heard. Central nervous system: Alert and oriented. No focal neurological deficits. Extremities: Symmetric 5 x 5  power. Skin: left thigh abscess Psychiatry: Judgement and insight appear normal. Mood & affect appropriate.   Data Reviewed: I have personally reviewed following labs and imaging studies  CBC: Recent Labs  Lab 10/26/17 0615 10/26/17 1358 10/27/17 0639 10/28/17 0443  WBC 7.7 9.5 12.3* 13.6*  HGB 7.4* 8.1* 7.7* 6.5*  HCT 24.2* 27.3* 26.0*  26.1* 21.8*  MCV 88.6 88.1 88.4 89.7  PLT 456* 464* 489* 421*   Basic Metabolic Panel: Recent Labs  Lab 10/26/17 0615 10/26/17 1358 10/27/17 0639 10/28/17 0443  NA 138  --  137 140  K 3.7  --  4.8 4.0  CL 104  --  101 105  CO2 25  --  26 28  GLUCOSE 102*  --  137* 91  BUN 14  --  17 19  CREATININE 0.76 0.72 0.94 0.96  CALCIUM 8.8*  --  9.3 8.6*  MG  --   --  1.7 1.8   GFR: Estimated Creatinine Clearance: 63.5 mL/min (by C-G formula based on SCr of 0.96 mg/dL). Liver Function Tests: Recent Labs  Lab 10/26/17 0615  AST 14*  ALT 8*  ALKPHOS 37*  BILITOT 0.6  PROT 7.0  ALBUMIN 2.4*   No results for input(s): LIPASE, AMYLASE in the last 168 hours. No results for input(s): AMMONIA in the last 168  hours. Coagulation Profile: No results for input(s): INR, PROTIME in the last 168 hours. Cardiac Enzymes: No results for input(s): CKTOTAL, CKMB, CKMBINDEX, TROPONINI in the last 168 hours. BNP (last 3 results) No results for input(s): PROBNP in the last 8760 hours. HbA1C: No results for input(s): HGBA1C in the last 72 hours. CBG: Recent Labs  Lab 10/25/17 2051  GLUCAP 119*   Lipid Profile: No results for input(s): CHOL, HDL, LDLCALC, TRIG, CHOLHDL, LDLDIRECT in the last 72 hours. Thyroid Function Tests: No results for input(s): TSH, T4TOTAL, FREET4, T3FREE, THYROIDAB in the last 72 hours. Anemia Panel: Recent Labs    10/27/17 0639  VITAMINB12 400  FERRITIN 138  TIBC 238*  IRON 31   Urine analysis: No results found for: COLORURINE, APPEARANCEUR, LABSPEC, PHURINE, GLUCOSEU, HGBUR, BILIRUBINUR, KETONESUR, PROTEINUR,  UROBILINOGEN, NITRITE, LEUKOCYTESUR Sepsis Labs: @LABRCNTIP (procalcitonin:4,lacticidven:4)    Recent Results (from the past 240 hour(s))  Aerobic Culture (superficial specimen)     Status: None   Collection Time: 10/21/17  9:40 AM  Result Value Ref Range Status   Specimen Description   Final    KNEE LEFT MED Performed at [facility], [address] [place], [place] [address]    Special Requests   Final    NONE Performed at [facility], [address] [place], [place] [address]    Gram Stain   Final    RARE WBC PRESENT,BOTH PMN AND MONONUCLEAR NO ORGANISMS SEEN Performed at [facility], [address] [place], [place] [address]    Culture FEW METHICILLIN RESISTANT STAPHYLOCOCCUS AUREUS  Final   Report Status 10/24/2017 FINAL  Final   Organism ID, Bacteria METHICILLIN RESISTANT STAPHYLOCOCCUS AUREUS  Final      Susceptibility   Methicillin resistant staphylococcus aureus - MIC*    CIPROFLOXACIN >=8 RESISTANT Resistant     ERYTHROMYCIN >=8 RESISTANT Resistant     GENTAMICIN <=0.5 SENSITIVE Sensitive     OXACILLIN >=4 RESISTANT Resistant     TETRACYCLINE >=16 RESISTANT Resistant     VANCOMYCIN <=0.5 SENSITIVE Sensitive     TRIMETH/SULFA <=10 SENSITIVE Sensitive     CLINDAMYCIN RESISTANT Resistant     RIFAMPIN <=0.5 SENSITIVE Sensitive     Inducible Clindamycin POSITIVE Resistant     * FEW METHICILLIN RESISTANT STAPHYLOCOCCUS AUREUS  MRSA PCR Screening     Status: Abnormal   Collection Time: 10/25/17  3:23 PM  Result Value Ref Range Status   MRSA by PCR POSITIVE (A) NEGATIVE Final  Culture, blood (routine x 2)     Status: None (Preliminary result)   Collection Time: 10/25/17 10:16 PM  Result Value Ref Range Status   Specimen Description   Final    BLOOD RIGHT ANTECUBITAL Performed at [facility], [address] [place], [place] [address]    Special Requests   Final    BOTTLES DRAWN AEROBIC AND  ANAEROBIC Blood Culture adequate volume Performed at [facility], [address] [place], [place] [address]    Culture   Final    NO GROWTH 2 DAYS Performed at [facility], [address] [place], [place] [address]    Report Status PENDING  Incomplete  Culture, blood (routine x 2)     Status: None (Preliminary result)   Collection Time: 10/25/17 10:16 PM  Result Value Ref Range Status   Specimen Description   Final    BLOOD LEFT HAND Performed at [facility], [address]  [place] [place], [place] [address]    Special Requests   Final    AEROBIC BOTTLE ONLY Blood Culture results may not be optimal due to an inadequate volume of blood received in culture bottles Performed at [facility], [address] [place], [place] [address]    Culture   Final    NO GROWTH 2 DAYS Performed at [facility], [address], [place], [place] [address]    Report Status PENDING  Incomplete  Aerobic/Anaerobic Culture (surgical/deep wound)     Status: None (Preliminary result)   Collection Time: 10/26/17  4:25 PM  Result Value Ref Range Status   Specimen Description ABSCESS LEFT THIGH  Final   Special Requests PATIENT ON FOLLOWING ANCEF SPEC A ON SWABS  Final   Gram Stain   Final    NO WBC SEEN NO ORGANISMS SEEN Performed at [facility], [address], [place], [place] [address]    Culture   Final    MODERATE METHICILLIN RESISTANT STAPHYLOCOCCUS AUREUS NO ANAEROBES ISOLATED; CULTURE IN PROGRESS FOR 5 DAYS    Report Status PENDING  Incomplete   Organism ID, Bacteria METHICILLIN RESISTANT STAPHYLOCOCCUS AUREUS  Final      Susceptibility   Methicillin resistant staphylococcus aureus - MIC*    CIPROFLOXACIN >=8 RESISTANT Resistant     ERYTHROMYCIN >=8 RESISTANT Resistant     GENTAMICIN <=0.5 SENSITIVE Sensitive     OXACILLIN >=4 RESISTANT Resistant     TETRACYCLINE >=16 RESISTANT Resistant     VANCOMYCIN 1 SENSITIVE  Sensitive     TRIMETH/SULFA <=10 SENSITIVE Sensitive     CLINDAMYCIN RESISTANT Resistant     RIFAMPIN <=0.5 SENSITIVE Sensitive     Inducible Clindamycin POSITIVE Resistant     * MODERATE METHICILLIN RESISTANT STAPHYLOCOCCUS AUREUS  Aerobic/Anaerobic Culture (surgical/deep wound)     Status: None (Preliminary result)   Collection Time: 10/26/17  4:45 PM  Result Value Ref Range Status   Specimen Description TISSUE LEFT THIGH  Final   Special Requests PATIENT ON FOLLOWING ANCEF SPEC B IN CUP  Final   Gram Stain   Final    RARE WBC PRESENT, PREDOMINANTLY PMN RARE GRAM POSITIVE COCCI Performed at [facility], [address], [place], [place] [address]    Culture   Final    MODERATE STAPHYLOCOCCUS AUREUS SUSCEPTIBILITIES PERFORMED ON PREVIOUS CULTURE WITHIN THE LAST 5 DAYS. NO ANAEROBES ISOLATED; CULTURE IN PROGRESS FOR 5 DAYS    Report Status PENDING  Incomplete      Radiology Studies: No results found.   Scheduled Meds: . docusate sodium  100 mg Oral BID  . heparin  5,000 Units Subcutaneous Q8H  . mupirocin ointment  1 application Nasal BID  . senna  1 tablet Oral Daily   Continuous Infusions: . methocarbamol (ROBAXIN)  IV    . piperacillin-tazobactam (ZOSYN)  IV 3.375 g (10/28/17 1537)  . vancomycin 1,750 mg (10/28/17 1537)     LOS: 3 days    Time spent: 25 minutes Greater than 50% of the time spent on counseling and coordinating the care.   Manson Passey, MD [facility] Pager [phone]  If 7PM-7AM, please contact night-coverage [email or web address] Password [facility] 10/28/2017, 6:37 PM

## 2017-10-28 NOTE — Progress Notes (Signed)
Pt back from PACU s/p repeat debridement of the left thigh abscess with wound back, dressing dry and intact, skin warm to touch, can wiggle toes, capillary refill < 2 secs, alert and oriented, room air, and she voided.

## 2017-10-28 NOTE — Anesthesia Procedure Notes (Signed)
Procedure Name: LMA Insertion Date/Time: 10/28/2017 7:23 AM Performed by: Coralee RudFlores, Raevyn Sokol, CRNA Pre-anesthesia Checklist: Patient identified, Emergency Drugs available, Suction available and Patient being monitored Patient Re-evaluated:Patient Re-evaluated prior to induction Oxygen Delivery Method: Circle system utilized Preoxygenation: Pre-oxygenation with 100% oxygen Induction Type: IV induction Ventilation: Mask ventilation without difficulty LMA Size: 4.0

## 2017-10-29 ENCOUNTER — Encounter (HOSPITAL_COMMUNITY): Payer: Self-pay | Admitting: Orthopedic Surgery

## 2017-10-29 DIAGNOSIS — N182 Chronic kidney disease, stage 2 (mild): Secondary | ICD-10-CM

## 2017-10-29 LAB — TYPE AND SCREEN
ABO/RH(D): O POS
Antibody Screen: NEGATIVE
Unit division: 0

## 2017-10-29 LAB — CBC
HEMATOCRIT: 26 % — AB (ref 36.0–46.0)
Hemoglobin: 7.8 g/dL — ABNORMAL LOW (ref 12.0–15.0)
MCH: 27 pg (ref 26.0–34.0)
MCHC: 30 g/dL (ref 30.0–36.0)
MCV: 90 fL (ref 78.0–100.0)
Platelets: 402 10*3/uL — ABNORMAL HIGH (ref 150–400)
RBC: 2.89 MIL/uL — ABNORMAL LOW (ref 3.87–5.11)
RDW: 20.1 % — ABNORMAL HIGH (ref 11.5–15.5)
WBC: 13.1 10*3/uL — AB (ref 4.0–10.5)

## 2017-10-29 LAB — MAGNESIUM: Magnesium: 1.9 mg/dL (ref 1.7–2.4)

## 2017-10-29 LAB — BASIC METABOLIC PANEL
Anion gap: 4 — ABNORMAL LOW (ref 5–15)
BUN: 17 mg/dL (ref 6–20)
CO2: 32 mmol/L (ref 22–32)
CREATININE: 0.87 mg/dL (ref 0.44–1.00)
Calcium: 8.7 mg/dL — ABNORMAL LOW (ref 8.9–10.3)
Chloride: 105 mmol/L (ref 101–111)
GFR calc Af Amer: >60 mL/min (ref >60)
GLUCOSE: 99 mg/dL (ref 65–99)
POTASSIUM: 4.1 mmol/L (ref 3.5–5.1)
SODIUM: 141 mmol/L (ref 135–145)

## 2017-10-29 LAB — BPAM RBC
BLOOD PRODUCT EXPIRATION DATE: 201905172359
ISSUE DATE / TIME: 201904262237
Unit Type and Rh: 5100

## 2017-10-29 NOTE — Plan of Care (Signed)
  Problem: Health Behavior/Discharge Planning: Goal: Ability to manage health-related needs will improve Outcome: Progressing   Problem: Clinical Measurements: Goal: Ability to maintain clinical measurements within normal limits will improve Outcome: Progressing Goal: Will remain free from infection Outcome: Progressing   

## 2017-10-29 NOTE — Progress Notes (Signed)
PROGRESS NOTE  Melissa Forbes:[id number] DOB: 05/06/1951 DOA: 10/25/2017 PCP: Myles Lipps, MD  HPI/Recap of past 24 hours:   Assessment/Plan: Active Problems:   Abscess   Abscess of left thigh   Abscess of left leg  Active Problems:   Abscess of left thigh - Debridement left thigh abscess and leg abscessx2 on 4/24 and 4/26 by Dr Lajoyce Corners,  wound vac in place. - wound culture + MRSA, blood culture no growth  - she is started on vanco and zosyn since admission, d/c zosyn on 4/26, wbc remain elevated, repeat cbc in am. -per Dr Lajoyce Corners Antibiotic duration: Continue antibiotics for at least 72 hours postoperatively. Weightbearing: Weightbearing as tolerated left lower extremity. Dressing care/ Wound VAC: Continue wound VAC for 1 week. Ambulatory devices: Walker - PT eval, home with hh vs SNF       Acute blood loss anemia on chronic anemia ( hgb baseline around 8) - hgb 6.5  - Transfuse 1 U PRBC on 4/26, hgb 7.8 on 4/27 - Follow up CBC in am   HTN:  Home bp med held, bp stable, may be able to resume home bp meds tomorrow  CKDII Renal function at baseline   Morbid obesity: Body mass index is 45.77 kg/m.   DVT prophylaxis: Heparin subQ Code Status: full code  Family Communication: patient and daughter over the phone Disposition Plan: home with home health Pt/RN , hopefully on 4/28 if hgb stable    Consultants:   Ortho, Dr. Lajoyce Corners   Procedures:   Debridement left thigh abscess and leg abscess 4/24 and 4/26  Antimicrobials:   Vanco from 4/23  zosyn 4/23 -->4/27    Objective: BP 140/72 (BP Location: Right Arm)   Pulse 71   Temp 98.1 F (36.7 C) (Oral)   Resp 17   Ht 5' (1.524 m)   Wt 106.3 kg (234 lb 5.6 oz)   SpO2 96%   BMI 45.77 kg/m   Intake/Output Summary (Last 24 hours) at 10/29/2017 1540 Last data filed at 10/29/2017 1501 Gross per 24 hour  Intake 1002 ml  Output 200 ml  Net 802 ml   Filed Weights   10/25/17 1425  10/26/17 1101  Weight: 106 kg (233 lb 11.2 oz) 106.3 kg (234 lb 5.6 oz)    Exam: Patient is examined daily including today on 10/29/2017, exams remain the same as of yesterday except that has changed    General:  NAD  Cardiovascular: RRR  Respiratory: CTABL  Abdomen: Soft/ND/NT, positive BS  Musculoskeletal: left leg post op changes , dressing and wound vac in place  Neuro: alert, oriented   Data Reviewed: Basic Metabolic Panel: Recent Labs  Lab 10/26/17 0615 10/26/17 1358 10/27/17 0639 10/28/17 0443 10/29/17 0502  NA 138  --  137 140 141  K 3.7  --  4.8 4.0 4.1  CL 104  --  101 105 105  CO2 25  --  26 28 32  GLUCOSE 102*  --  137* 91 99  BUN 14  --  CREATININE 0.76 0.72 0.94 0.96 0.87  CALCIUM 8.8*  --  9.3 8.6* 8.7*  MG  --   --  1.7 1.8 1.9   Liver Function Tests: Recent Labs  Lab 10/26/17 0615  AST 14*  ALT 8*  ALKPHOS 37*  BILITOT 0.6  PROT 7.0  ALBUMIN 2.4*   No results for input(s): LIPASE, AMYLASE in the last 168 hours. No results for input(s): AMMONIA in  the last 168 hours. CBC: Recent Labs  Lab 10/26/17 0615 10/26/17 1358 10/27/17 0639 10/28/17 0443 10/29/17 0502  WBC 7.7 9.5 12.3* 13.6* 13.1*  HGB 7.4* 8.1* 7.7* 6.5* 7.8*  HCT 24.2* 27.3* 26.0*  26.1* 21.8* 26.0*  MCV 88.6 88.1 88.4 89.7 90.0  PLT 456* 464* 489* 421* 402*   Cardiac Enzymes:   No results for input(s): CKTOTAL, CKMB, CKMBINDEX, TROPONINI in the last 168 hours. BNP (last 3 results) No results for input(s): BNP in the last 8760 hours.  ProBNP (last 3 results) No results for input(s): PROBNP in the last 8760 hours.  CBG: Recent Labs  Lab 10/25/17 2051  GLUCAP 119*    Recent Results (from the past 240 hour(s))  Aerobic Culture (superficial specimen)     Status: None   Collection Time: 10/21/17  9:40 AM  Result Value Ref Range Status   Specimen Description   Final    KNEE LEFT MED Performed at [facility], [address],  [place], [place] [address]    Special Requests   Final    NONE Performed at [facility], [address], [place], [place] [address]    Gram Stain   Final    RARE WBC PRESENT,BOTH PMN AND MONONUCLEAR NO ORGANISMS SEEN Performed at [facility], [address], [place], [place] [address]    Culture FEW METHICILLIN RESISTANT STAPHYLOCOCCUS AUREUS  Final   Report Status 10/24/2017 FINAL  Final   Organism ID, Bacteria METHICILLIN RESISTANT STAPHYLOCOCCUS AUREUS  Final      Susceptibility   Methicillin resistant staphylococcus aureus - MIC*    CIPROFLOXACIN >=8 RESISTANT Resistant     ERYTHROMYCIN >=8 RESISTANT Resistant     GENTAMICIN <=0.5 SENSITIVE Sensitive     OXACILLIN >=4 RESISTANT Resistant     TETRACYCLINE >=16 RESISTANT Resistant     VANCOMYCIN <=0.5 SENSITIVE Sensitive     TRIMETH/SULFA <=10 SENSITIVE Sensitive     CLINDAMYCIN RESISTANT Resistant     RIFAMPIN <=0.5 SENSITIVE Sensitive     Inducible Clindamycin POSITIVE Resistant     * FEW METHICILLIN RESISTANT STAPHYLOCOCCUS AUREUS  MRSA PCR Screening     Status: Abnormal   Collection Time: 10/25/17  3:23 PM  Result Value Ref Range Status   MRSA by PCR POSITIVE (A) NEGATIVE Final    Comment:        The GeneXpert MRSA Assay (FDA approved for NASAL specimens only), is one component of a comprehensive MRSA colonization surveillance program. It is not intended to diagnose MRSA infection nor to guide or monitor treatment for MRSA infections. RESULT CALLED TO, READ BACK BY AND VERIFIED WITH: BLOCK,D. RN  ON 04.23.19 BY COHEN,K Performed at [facility], [address], [place], [place] [address]   Culture, blood (routine x 2)     Status: None (Preliminary result)   Collection Time: 10/25/17 10:16 PM  Result Value Ref Range Status   Specimen Description   Final    BLOOD RIGHT ANTECUBITAL Performed at [facility], [address] [place], [place]  [address]    Special Requests   Final    BOTTLES DRAWN AEROBIC AND ANAEROBIC Blood Culture adequate volume Performed at [facility], [address] [place], [place] [address]    Culture   Final    NO GROWTH 3 DAYS Performed at [facility], [address] [place], [place] [address]    Report Status PENDING  Incomplete  Culture, blood (routine x 2)     Status: None (Preliminary result)   Collection Time: 10/25/17 10:16 PM  Result Value Ref Range Status   Specimen Description   Final    BLOOD LEFT HAND Performed at [facility], [address], [place], [place] [address]    Special Requests   Final    BOTTLES DRAWN AEROBIC ONLY Blood Culture results may not be optimal due to an inadequate volume of blood received in culture bottles   Culture   Final    NO GROWTH 3 DAYS Performed at [facility], [address], [place], [place] [address]    Report Status PENDING  Incomplete  Aerobic/Anaerobic Culture (surgical/deep wound)     Status: None (Preliminary result)   Collection Time: 10/26/17  4:25 PM  Result Value Ref Range Status   Specimen Description ABSCESS LEFT THIGH  Final   Special Requests PATIENT ON FOLLOWING ANCEF SPEC A ON SWABS  Final   Gram Stain   Final    NO WBC SEEN NO ORGANISMS SEEN Performed at [facility], [address], [place], [place] [address]    Culture   Final    MODERATE METHICILLIN RESISTANT STAPHYLOCOCCUS AUREUS NO ANAEROBES ISOLATED; CULTURE IN PROGRESS FOR 5 DAYS    Report Status PENDING  Incomplete   Organism ID, Bacteria METHICILLIN RESISTANT STAPHYLOCOCCUS AUREUS  Final      Susceptibility   Methicillin resistant staphylococcus aureus - MIC*    CIPROFLOXACIN >=8 RESISTANT Resistant     ERYTHROMYCIN >=8 RESISTANT Resistant     GENTAMICIN <=0.5 SENSITIVE Sensitive     OXACILLIN >=4 RESISTANT Resistant     TETRACYCLINE >=16 RESISTANT Resistant     VANCOMYCIN 1 SENSITIVE Sensitive      TRIMETH/SULFA <=10 SENSITIVE Sensitive     CLINDAMYCIN RESISTANT Resistant     RIFAMPIN <=0.5 SENSITIVE Sensitive     Inducible Clindamycin POSITIVE Resistant     * MODERATE METHICILLIN RESISTANT STAPHYLOCOCCUS AUREUS  Aerobic/Anaerobic Culture (surgical/deep wound)     Status: None (Preliminary result)   Collection Time: 10/26/17  4:45 PM  Result Value Ref Range Status   Specimen Description TISSUE LEFT THIGH  Final   Special Requests PATIENT ON FOLLOWING ANCEF SPEC B IN CUP  Final   Gram Stain   Final    RARE WBC PRESENT, PREDOMINANTLY PMN RARE GRAM POSITIVE COCCI Performed at [facility], [address], [place], [place] [address]    Culture   Final    MODERATE STAPHYLOCOCCUS AUREUS SUSCEPTIBILITIES PERFORMED ON PREVIOUS CULTURE WITHIN THE LAST 5 DAYS. NO ANAEROBES ISOLATED; CULTURE IN PROGRESS FOR 5 DAYS    Report Status PENDING  Incomplete     Studies: No results found.  Scheduled Meds: . Chlorhexidine Gluconate Cloth  6 each Topical Q0600  . docusate sodium  100 mg Oral BID  . heparin  5,000 Units Subcutaneous Q8H  . mupirocin ointment  1 application Nasal BID  . senna  1 tablet Oral Daily    Continuous Infusions: . sodium chloride 10 mL/hr (10/26/17 1845)  . lactated ringers 10 mL/hr at 10/26/17 1506  . methocarbamol (ROBAXIN)  IV    . vancomycin 1,750 mg (10/28/17 1537)     Time spent: 35 mins I have personally reviewed and interpreted on  10/29/2017 daily labs, tele strips, imagings as discussed above under date review session and assessment and plans.  I reviewed all nursing notes, pharmacy notes, consultant notes,  vitals, pertinent old records  I have discussed plan of care as described above with RN , patient and family on 10/29/2017   Albertine Grates MD, PhD  [facility] Pager [phone]. If 7PM-7AM, please contact night-coverage at [email or web address], password [facility] 10/29/2017, 3:40 PM  LOS: 4 days

## 2017-10-30 LAB — CBC
HEMATOCRIT: 26.9 % — AB (ref 36.0–46.0)
HEMOGLOBIN: 8 g/dL — AB (ref 12.0–15.0)
MCH: 27 pg (ref 26.0–34.0)
MCHC: 29.7 g/dL — AB (ref 30.0–36.0)
MCV: 90.9 fL (ref 78.0–100.0)
Platelets: 422 10*3/uL — ABNORMAL HIGH (ref 150–400)
RBC: 2.96 MIL/uL — ABNORMAL LOW (ref 3.87–5.11)
RDW: 20.4 % — ABNORMAL HIGH (ref 11.5–15.5)
WBC: 13.2 10*3/uL — ABNORMAL HIGH (ref 4.0–10.5)

## 2017-10-30 LAB — PATHOLOGIST SMEAR REVIEW
Basophils Absolute: 0 10*3/uL (ref 0.0–0.2)
Basos: 0 %
EOS (ABSOLUTE): 0.2 10*3/uL (ref 0.0–0.4)
Eos: 2 %
Hematocrit: 25.9 % — ABNORMAL LOW (ref 34.0–46.6)
Hemoglobin: 8 g/dL — ABNORMAL LOW (ref 11.1–15.9)
Immature Grans (Abs): 0 10*3/uL (ref 0.0–0.1)
Immature Granulocytes: 0 %
Lymphocytes Absolute: 2.8 10*3/uL (ref 0.7–3.1)
Lymphs: 28 %
MCH: 26.1 pg — ABNORMAL LOW (ref 26.6–33.0)
MCHC: 30.9 g/dL — ABNORMAL LOW (ref 31.5–35.7)
MCV: 84 fL (ref 79–97)
Monocytes Absolute: 0.9 10*3/uL (ref 0.1–0.9)
Monocytes: 9 %
Neutrophils Absolute: 6.1 10*3/uL (ref 1.4–7.0)
Neutrophils: 61 %
Path Rev WBC: NORMAL
Platelets: 573 10*3/uL — ABNORMAL HIGH (ref 150–379)
RBC: 3.07 x10E6/uL — ABNORMAL LOW (ref 3.77–5.28)
RDW: 19.8 % — ABNORMAL HIGH (ref 12.3–15.4)
WBC: 10 10*3/uL (ref 3.4–10.8)

## 2017-10-30 LAB — BASIC METABOLIC PANEL
ANION GAP: 4 — AB (ref 5–15)
BUN: 16 mg/dL (ref 6–20)
CO2: 32 mmol/L (ref 22–32)
Calcium: 8.8 mg/dL — ABNORMAL LOW (ref 8.9–10.3)
Chloride: 104 mmol/L (ref 101–111)
Creatinine, Ser: 0.73 mg/dL (ref 0.44–1.00)
GFR calc Af Amer: >60 mL/min (ref >60)
GLUCOSE: 77 mg/dL (ref 65–99)
POTASSIUM: 4.1 mmol/L (ref 3.5–5.1)
Sodium: 140 mmol/L (ref 135–145)

## 2017-10-30 LAB — MAGNESIUM: Magnesium: 1.8 mg/dL (ref 1.7–2.4)

## 2017-10-30 NOTE — Progress Notes (Signed)
Patient ID: Melissa Forbes, female   DOB: Aug 21, 1950, 67 y.o.   MRN: [id number]  PROGRESS NOTE    Melissa Forbes  WUJ:[id number] DOB: 1950/09/27 DOA: 10/25/2017  PCP: Melissa Lipps, MD   Brief Narrative:  67 year old female who presented to [facility] for left leg abscess and is status post I&D 4/25.  Assessment & Plan:   Active Problems:   Abscess of left thigh / MRSA infection / Leukocytosis  - Debridement left thigh abscess and leg abscess 4/26 done by Dr. Lajoyce Corners - BLood cx negative so far but wound cx from debridement are growing MRSA - Continue pain management efforts - Continue vanco and zosyn, per Dr. Lajoyce Corners at least 72 hours postoperatively which she shoulder finish tomorrow and then she can go home with home health and wound care - She shoulder follow up with Dr. Lajoyce Corners in 1 week after discharge    Acute blood loss anemia - ?Postoperative - Transfuse 1 U PRBC 4/26 - Hgb this am 8 - Follow up Hgb in am   DVT prophylaxis: Hep subQ Code Status: full code  Family Communication: spoke with pt daughter over the phone this am Disposition Plan: home in am if hgb stable    Consultants:   Ortho, Dr. Lajoyce Corners   Procedures:   Debridement left thigh abscess and leg abscess 4/26  Antimicrobials:   Vanco and zosyn 4/23 -->   Subjective: No overnight events.  Objective: Vitals:   10/29/17 0533 10/29/17 1500 10/29/17 2046 10/30/17 0451  BP: (!) 156/75 140/72 (!) 154/72 (!) 177/76  Pulse: 77 71 85 78  Resp: Temp: 98.1 F (36.7 C) 98.1 F (36.7 C) 98.2 F (36.8 C) 97.9 F (36.6 C)  TempSrc: Oral Oral Oral Oral  SpO2: 95% 96% 97% 98%  Weight:      Height:        Intake/Output Summary (Last 24 hours) at 10/30/2017 0928 Last data filed at 10/29/2017 1836 Gross per 24 hour  Intake 1164 ml  Output -  Net 1164 ml   Filed Weights   10/25/17 1425 10/26/17 1101  Weight: 106 kg (233 lb 11.2 oz) 106.3 kg (234 lb 5.6 oz)    Physical Exam  Constitutional: Appears  well-developed and well-nourished. No distress.  HENT: Normocephalic. External right and left ear normal. Oropharynx is clear and moist.  Eyes: Conjunctivae and EOM are normal. PERRLA, no scleral icterus.  Neck: Normal ROM. Neck supple. No JVD. No tracheal deviation. No thyromegaly.  CVS: RRR, S1/S2 +, no murmurs, no gallops, no carotid bruit.  Pulmonary: Effort and breath sounds normal, no stridor, rhonchi, wheezes, rales.  Abdominal: Soft. BS +,  no distension, tenderness, rebound or guarding.  Musculoskeletal: Normal range of motion. No edema and no tenderness.  Lymphadenopathy: No lymphadenopathy noted, cervical, inguinal. Neuro: Alert. Normal reflexes, muscle tone coordination. No cranial nerve deficit. Skin: left leg ace dressing in place  Psychiatric: Normal mood and affect. Behavior, judgment, thought content normal.    Data Reviewed: I have personally reviewed following labs and imaging studies  CBC: Recent Labs  Lab 10/26/17 1358 10/27/17 0639 10/28/17 0443 10/29/17 0502 10/30/17 0656  WBC 9.5 12.3* 13.6* 13.1* 13.2*  HGB 8.1* 7.7* 6.5* 7.8* 8.0*  HCT 27.3* 26.0*  26.1* 21.8* 26.0* 26.9*  MCV 88.1 88.4 89.7 90.0 90.9  PLT 464* 489* 421* 402* 422*   Basic Metabolic Panel: Recent Labs  Lab 10/26/17 0615 10/26/17 1358 10/27/17 9562 10/28/17 0443 10/29/17 0502  NA 138  --  137 140 141  K 3.7  --  4.8 4.0 4.1  CL 104  --  101 105 105  CO2 25  --  26 28 32  GLUCOSE 102*  --  137* 91 99  BUN 14  --  CREATININE 0.76 0.72 0.94 0.96 0.87  CALCIUM 8.8*  --  9.3 8.6* 8.7*  MG  --   --  1.7 1.8 1.9   GFR: Estimated Creatinine Clearance: 70.1 mL/min (by C-G formula based on SCr of 0.87 mg/dL). Liver Function Tests: Recent Labs  Lab 10/26/17 0615  AST 14*  ALT 8*  ALKPHOS 37*  BILITOT 0.6  PROT 7.0  ALBUMIN 2.4*   No results for input(s): LIPASE, AMYLASE in the last 168 hours. No results for input(s): AMMONIA in the last 168 hours. Coagulation  Profile: No results for input(s): INR, PROTIME in the last 168 hours. Cardiac Enzymes: No results for input(s): CKTOTAL, CKMB, CKMBINDEX, TROPONINI in the last 168 hours. BNP (last 3 results) No results for input(s): PROBNP in the last 8760 hours. HbA1C: No results for input(s): HGBA1C in the last 72 hours. CBG: Recent Labs  Lab 10/25/17 2051  GLUCAP 119*   Lipid Profile: No results for input(s): CHOL, HDL, LDLCALC, TRIG, CHOLHDL, LDLDIRECT in the last 72 hours. Thyroid Function Tests: No results for input(s): TSH, T4TOTAL, FREET4, T3FREE, THYROIDAB in the last 72 hours. Anemia Panel: No results for input(s): VITAMINB12, FOLATE, FERRITIN, TIBC, IRON, RETICCTPCT in the last 72 hours. Urine analysis: No results found for: COLORURINE, APPEARANCEUR, LABSPEC, PHURINE, GLUCOSEU, HGBUR, BILIRUBINUR, KETONESUR, PROTEINUR, UROBILINOGEN, NITRITE, LEUKOCYTESUR Sepsis Labs: (procalcitonin:4,lacticidven:4)    Aerobic Culture (superficial specimen)     Status: None   Collection Time: 10/21/17  9:40 AM  Result Value Ref Range Status   Specimen Description   Final    KNEE LEFT MED   Report Status 10/24/2017 FINAL  Final   Organism ID, BacterIa MRSA  Final      Susceptibility   Methicillin resistant staphylococcus aureus - MIC*    CIPROFLOXACIN >=8 RESISTANT Resistant     ERYTHROMYCIN >=8 RESISTANT Resistant     GENTAMICIN <=0.5 SENSITIVE Sensitive     OXACILLIN >=4 RESISTANT Resistant     TETRACYCLINE >=16 RESISTANT Resistant     VANCOMYCIN <=0.5 SENSITIVE Sensitive     TRIMETH/SULFA <=10 SENSITIVE Sensitive     CLINDAMYCIN RESISTANT Resistant     RIFAMPIN <=0.5 SENSITIVE Sensitive     Inducible Clindamycin POSITIVE Resistant     * FEW METHICILLIN RESISTANT STAPHYLOCOCCUS AUREUS  MRSA PCR Screening     Status: Abnormal   Collection Time: 10/25/17  3:23 PM  Result Value Ref Range Status   MRSA by PCR POSITIVE (A) NEGATIVE Final  Culture, blood (routine x 2)     Status:  None (Preliminary result)   Collection Time: 10/25/17 10:16 PM  Result Value Ref Range Status   Specimen Description   Final    BLOOD RIGHT ANTECUBITAL   Culture   Final    NO GROWTH 2 DAYS   Report Status PENDING  Incomplete  Culture, blood (routine x 2)     Status: None (Preliminary result)   Collection Time: 10/25/17 10:16 PM  Result Value Ref Range Status   Specimen Description   Final    BLOOD LEFT HAND   Culture   Final    NO GROWTH 2 DAYS   Report Status PENDING  Incomplete  Aerobic/Anaerobic Culture (  surgical/deep wound)     Status: None (Preliminary result)   Collection Time: 10/26/17  4:25 PM  Result Value Ref Range Status   Specimen Description ABSCESS LEFT THIGH  Final   Report Status PENDING  Incomplete   Organism ID, Bacteria MRSA  Final      Susceptibility   Methicillin resistant staphylococcus aureus - MIC*    CIPROFLOXACIN >=8 RESISTANT Resistant     ERYTHROMYCIN >=8 RESISTANT Resistant     GENTAMICIN <=0.5 SENSITIVE Sensitive     OXACILLIN >=4 RESISTANT Resistant     TETRACYCLINE >=16 RESISTANT Resistant     VANCOMYCIN 1 SENSITIVE Sensitive     TRIMETH/SULFA <=10 SENSITIVE Sensitive     CLINDAMYCIN RESISTANT Resistant     RIFAMPIN <=0.5 SENSITIVE Sensitive     Inducible Clindamycin POSITIVE Resistant     * MODERATE METHICILLIN RESISTANT STAPHYLOCOCCUS AUREUS  Aerobic/Anaerobic Culture (surgical/deep wound)     Status: None (Preliminary result)   Collection Time: 10/26/17  4:45 PM  Result Value Ref Range Status   Specimen Description TISSUE LEFT THIGH  Final   Gram Stain   Final   Culture   Final    MODERATE STAPHYLOCOCCUS AUREUS   Report Status PENDING  Incomplete      Radiology Studies: No results found.   Scheduled Meds: . docusate sodium  100 mg Oral BID  . heparin  5,000 Units Subcutaneous Q8H  . mupirocin ointment  1 application Nasal BID  . senna  1 tablet Oral Daily   Continuous Infusions: . methocarbamol (ROBAXIN)  IV    .  piperacillin-tazobactam (ZOSYN)  IV 3.375 g (10/28/17 1537)  . vancomycin 1,750 mg (10/28/17 1537)     LOS: 5 days    Time spent: 25 minutes Greater than 50% of the time spent on counseling and coordinating the care.   Manson Passey, MD [facility] Pager [phone]  If 7PM-7AM, please contact night-coverage [email or web address] Password [facility] 10/30/2017, 9:28 AM

## 2017-10-30 NOTE — Evaluation (Signed)
Physical Therapy Evaluation Patient Details Name: Melissa Forbes MRN: [id number] DOB: 10-13-1950 Today's Date: 10/30/2017   History of Present Illness     67 y.o. female with a diagnosis of Abscess Left Thigh who failed conservative measures and elected for surgical management.  Underwent surgical debridement 4/26, now with would VAC to left thigh.    Clinical Impression  Pt presents with moderate limitations to functional mobility due to location and size of thigh wound, mild weakness, decr activity endurance, resulting in dependencies in walking.  Pt is doing remarkably well postoperatively considering her medical problem, and recommend she walk in halls 2-3 times daily with nursing assist.  PT will follow acutely, and continues to recommend discharge to home with HHPT.     Follow Up Recommendations Home health PT;Supervision - Intermittent    Equipment Recommendations  3in1 (PT)    Recommendations for Other Services       Precautions / Restrictions Precautions Precautions: Fall Restrictions LLE Weight Bearing: Weight bearing as tolerated Other Position/Activity Restrictions: wound VAC to LLE medial thigh      Mobility  Bed Mobility               General bed mobility comments: not observed  Transfers Overall transfer level: Needs assistance Equipment used: Rolling walker (2 wheeled) Transfers: Sit to/from Stand Sit to Stand: Supervision         General transfer comment: assist to manage lines, prepare VAC for ambulation, and stand by for safety  Ambulation/Gait Ambulation/Gait assistance: Supervision Ambulation Distance (Feet): 300 Feet Assistive device: Rolling walker (2 wheeled) Gait Pattern/deviations: Step-through pattern;Decreased stride length;Wide base of support     General Gait Details: cues to maintain alignment with RW; slower but steady gait, no incr pain, no incr WOB,   Stairs            Wheelchair Mobility    Modified Rankin  (Stroke Patients Only)       Balance Overall balance assessment: Mild deficits observed, not formally tested Sitting-balance support: Feet supported Sitting balance-Leahy Scale: Good     Standing balance support: Bilateral upper extremity supported;No upper extremity supported;During functional activity Standing balance-Leahy Scale: Fair                               Pertinent Vitals/Pain Pain Score: 2  Pain Location: L LE Pain Descriptors / Indicators: Sore Pain Intervention(s): Limited activity within patient's tolerance;Monitored during session;Repositioned    Home Living Family/patient expects to be discharged to:: Private residence Living Arrangements: Children Available Help at Discharge: Family;Available PRN/intermittently Type of Home: House Home Access: Stairs to enter   Entrance Stairs-Number of Steps: 1 Home Layout: One level Home Equipment: Walker - 2 wheels;Shower seat;Toilet riser      Prior Function Level of Independence: Independent with assistive device(s)         Comments: ambulates with RW     Hand Dominance        Extremity/Trunk Assessment   Upper Extremity Assessment Upper Extremity Assessment: Overall WFL for tasks assessed    Lower Extremity Assessment Lower Extremity Assessment: LLE deficits/detail LLE Deficits / Details: wound/VAC to LLE       Communication   Communication: No difficulties  Cognition Arousal/Alertness: Awake/alert Behavior During Therapy: WFL for tasks assessed/performed Overall Cognitive Status: Within Functional Limits for tasks assessed  General Comments General comments (skin integrity, edema, etc.): VAC in place, minimal drainage into container observed; LLE wrapped intact    Exercises     Assessment/Plan    PT Assessment Patient needs continued PT services  PT Problem List Decreased strength;Decreased range of motion;Decreased  activity tolerance;Decreased balance;Decreased mobility;Decreased knowledge of use of DME;Obesity;Decreased skin integrity       PT Treatment Interventions DME instruction;Gait training;Stair training;Functional mobility training;Therapeutic activities;Therapeutic exercise;Balance training;Neuromuscular re-education;Patient/family education    PT Goals (Current goals can be found in the Care Plan section)  Acute Rehab PT Goals Patient Stated Goal: decrease pain, walk without use of a RW PT Goal Formulation: With patient Time For Goal Achievement: 11/13/17 Potential to Achieve Goals: Good    Frequency Min 3X/week   Barriers to discharge        Co-evaluation               AM-PAC PT "6 Clicks" Daily Activity  Outcome Measure Difficulty turning over in bed (including adjusting bedclothes, sheets and blankets)?: A Little Difficulty moving from lying on back to sitting on the side of the bed? : A Little Difficulty sitting down on and standing up from a chair with arms (e.g., wheelchair, bedside commode, etc,.)?: A Little Help needed moving to and from a bed to chair (including a wheelchair)?: A Little Help needed walking in hospital room?: A Little Help needed climbing 3-5 steps with a railing? : A Lot 6 Click Score: 17    End of Session Equipment Utilized During Treatment: Gait belt Activity Tolerance: Patient tolerated treatment well Patient left: in chair;with call bell/phone within reach Nurse Communication: Mobility status PT Visit Diagnosis: Other abnormalities of gait and mobility (R26.89)    Time: 1610-9604 PT Time Calculation (min) (ACUTE ONLY): 24 min   Charges:   PT Evaluation $PT Eval Moderate Complexity: 1 Mod PT Treatments $Gait Training: 8-22 mins   PT G Codes:        Narda Amber, PT, DPT, MS Board Certified Geriatric Clinical Specialist  Dennis Bast 10/30/2017, 3:20 PM

## 2017-10-30 NOTE — Plan of Care (Signed)
  Problem: Clinical Measurements: Goal: Ability to maintain clinical measurements within normal limits will improve Outcome: Progressing Goal: Will remain free from infection Outcome: Progressing Goal: Diagnostic test results will improve Outcome: Progressing   Problem: Activity: Goal: Risk for activity intolerance will decrease Outcome: Progressing   Problem: Pain Managment: Goal: General experience of comfort will improve Outcome: Progressing   Problem: Safety: Goal: Ability to remain free from injury will improve Outcome: Progressing   Problem: Skin Integrity: Goal: Risk for impaired skin integrity will decrease Outcome: Progressing   

## 2017-10-31 LAB — BASIC METABOLIC PANEL
ANION GAP: 10 (ref 5–15)
BUN: 12 mg/dL (ref 6–20)
CHLORIDE: 99 mmol/L — AB (ref 101–111)
CO2: 31 mmol/L (ref 22–32)
Calcium: 9.5 mg/dL (ref 8.9–10.3)
Creatinine, Ser: 0.58 mg/dL (ref 0.44–1.00)
GFR calc Af Amer: >60 mL/min (ref >60)
GLUCOSE: 88 mg/dL (ref 65–99)
POTASSIUM: 3.9 mmol/L (ref 3.5–5.1)
Sodium: 140 mmol/L (ref 135–145)

## 2017-10-31 LAB — AEROBIC/ANAEROBIC CULTURE (SURGICAL/DEEP WOUND): GRAM STAIN: NONE SEEN

## 2017-10-31 LAB — CBC
HEMATOCRIT: 28.8 % — AB (ref 36.0–46.0)
Hemoglobin: 8.8 g/dL — ABNORMAL LOW (ref 12.0–15.0)
MCH: 28 pg (ref 26.0–34.0)
MCHC: 30.6 g/dL (ref 30.0–36.0)
MCV: 91.7 fL (ref 78.0–100.0)
Platelets: 465 10*3/uL — ABNORMAL HIGH (ref 150–400)
RBC: 3.14 MIL/uL — ABNORMAL LOW (ref 3.87–5.11)
RDW: 21.2 % — AB (ref 11.5–15.5)
WBC: 12.7 10*3/uL — ABNORMAL HIGH (ref 4.0–10.5)

## 2017-10-31 LAB — CULTURE, BLOOD (ROUTINE X 2)
CULTURE: NO GROWTH
Culture: NO GROWTH
Special Requests: ADEQUATE

## 2017-10-31 LAB — AEROBIC/ANAEROBIC CULTURE W GRAM STAIN (SURGICAL/DEEP WOUND)

## 2017-10-31 LAB — MAGNESIUM: Magnesium: 1.9 mg/dL (ref 1.7–2.4)

## 2017-10-31 MED ORDER — SULFAMETHOXAZOLE-TRIMETHOPRIM 800-160 MG PO TABS: 1.0000 | ORAL_TABLET | Freq: Two times a day (BID) | ORAL | 0 refills | Status: AC

## 2017-10-31 NOTE — Progress Notes (Signed)
1630 wound vac switched to Melissa Forbes. Dc instructions given to pt. All questions answered. Dc to home with daughter
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
## 2017-10-31 NOTE — Progress Notes (Signed)
Patient ID: Melissa Forbes, female   DOB: 1950-12-14, 67 y.o.   MRN: [id number] Patient's surgical incisions are closed.  She does not need further surgery on Wednesday.  Discharge on the [employer] portable wound VAC pump that can be obtained from the operating room.  Patient's cultures were positive for MRSA were sensitive to sulfamethoxazole trimethoprim.  Recommend discharge on Bactrim DS for 4 weeks.  Patient may be weightbearing as tolerated I will follow-up in the office in 1 week.  She is to keep the dressing clean and dry.
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
## 2017-10-31 NOTE — Care Management Note (Addendum)
Case Management Note  Patient Details  Name: Kenadee Gates MRN: [id number] Date of Birth: [birth year]  Subjective/Objective:                    Action/Plan: Ordered 3 in 1 through Tresa Res at [facility].  Expected Discharge Date:  (unknown)               Expected Discharge Plan:  Home w Home Health Services  In-House Referral:     Discharge planning Services  CM Consult  Post Acute Care Choice:  Resumption of Svcs/PTA Provider Choice offered to:  Patient  DME Arranged:  N/A DME Agency:  NA  HH Arranged:  RN HH Agency:  [facility]  Status of Service:  Completed, signed off  If discussed at Long Length of Stay Meetings, dates discussed:    Additional Comments:  Kingsley Plan, RN 10/31/2017, 12:17 PM

## 2017-11-01 ENCOUNTER — Encounter (INDEPENDENT_AMBULATORY_CARE_PROVIDER_SITE_OTHER): Payer: Self-pay | Admitting: Orthopedic Surgery

## 2017-11-01 ENCOUNTER — Other Ambulatory Visit (INDEPENDENT_AMBULATORY_CARE_PROVIDER_SITE_OTHER): Payer: Self-pay

## 2017-11-01 ENCOUNTER — Inpatient Hospital Stay (INDEPENDENT_AMBULATORY_CARE_PROVIDER_SITE_OTHER): Payer: BLUE CROSS/BLUE SHIELD | Admitting: Orthopedic Surgery

## 2017-11-01 ENCOUNTER — Ambulatory Visit (INDEPENDENT_AMBULATORY_CARE_PROVIDER_SITE_OTHER): Payer: BLUE CROSS/BLUE SHIELD | Admitting: Orthopedic Surgery

## 2017-11-01 VITALS — Ht 60.0 in | Wt 234.0 lb

## 2017-11-01 DIAGNOSIS — L02416 Cutaneous abscess of left lower limb: Secondary | ICD-10-CM

## 2017-11-01 NOTE — Progress Notes (Signed)
Office Visit Note   Patient: Melissa Forbes           Date of Birth: 11/25/1950           MRN: [id number] Visit Date: 11/01/2017              Requested by: Myles Lipps, MD [address] [place], [place] [address] PCP: Myles Lipps, MD  Chief Complaint  Patient presents with  . Right Leg - Wound Check    10/26/17 left thigh I&D abscess , repeat I&D 10/28/17 application of vac      HPI: Patient is a 67 year old woman who presents status post 2 irrigation and debridements for a abscess left thigh and left leg.  Initial cultures were positive for MRSA.  Patient underwent installation wound VAC therapy.  Patient states that when she went home she dropped the wound VAC pump and it has not worked since that time.  Assessment & Plan: Visit Diagnoses:  1. Abscess of left thigh     Plan: We will start her on serial compression wraps for the left lower extremity we [facility] for [facility] a week dressing changes with 4 x 4's plus a Dynaflex compressive wrap from the metatarsal heads has the wound on the left thigh.  Follow-Up Instructions: Return in about 1 week (around 11/08/2017).   Ortho Exam  Patient is alert, oriented, no adenopathy, well-dressed, normal affect, normal respiratory effort. Examination there is a good healthy granulation tissue along the thigh wound edges.  There was initially some clear serosanguineous drainage however this has stopped at this time.  There is no redness no cellulitis but patient does have venous stasis changes.  The tibial wound is well approximated without drainage.  Imaging: No results found. No images are attached to the encounter.  Labs: Lab Results  Component Value Date   HGBA1C 5.3 07/21/2017   REPTSTATUS 10/31/2017 FINAL 10/26/2017   GRAMSTAIN  10/26/2017    RARE WBC PRESENT, PREDOMINANTLY PMN RARE GRAM POSITIVE COCCI    CULT  10/26/2017    MODERATE STAPHYLOCOCCUS AUREUS SUSCEPTIBILITIES PERFORMED ON PREVIOUS  CULTURE WITHIN THE LAST 5 DAYS. NO ANAEROBES ISOLATED Performed at [facility], [address], [place], [place] [address]    LABORGA METHICILLIN RESISTANT STAPHYLOCOCCUS AUREUS 10/26/2017    Lab Results  Component Value Date/Time   HGBA1C 5.3 07/21/2017 03:33 PM    Body mass index is 45.7 kg/m.  Orders:  No orders of the defined types were placed in this encounter.  No orders of the defined types were placed in this encounter.    Procedures: No procedures performed  Clinical Data: No additional findings.  ROS:  All other systems negative, except as noted in the HPI. Review of Systems  Objective: Vital Signs: Ht 5' (1.524 m)   Wt 234 lb (106.1 kg)   BMI 45.70 kg/m   Specialty Comments:  No specialty comments available.  PMFS History: Patient Active Problem List   Diagnosis Date Noted  . Abscess of left leg   . Abscess of left thigh   . Abscess 10/25/2017   Past Medical History:  Diagnosis Date  . Diverticulitis   . Hyperlipidemia   . Hypertension     History reviewed. No pertinent family history.  Past Surgical History:  Procedure Laterality Date  . APPLICATION OF WOUND VAC Left 10/26/2017   Procedure: APPLICATION OF WOUND VAC;  Surgeon: Nadara Mustard, MD;  Location: [facility] OR;  Service: Orthopedics;  Laterality: Left;  . I&D EXTREMITY Left 10/26/2017   Procedure: LEFT THIGH ABSCESS INCISION AND DRAINAGE, IRRIGATION AND DEBRIDEMENT;  Surgeon: Nadara Mustard, MD;  [facility] OR;  Service: Orthopedics;  Laterality: Left;  . I&D EXTREMITY Left 10/28/2017   Procedure: REPEAT DEBRIDEMENT LEFT THIGH ABSCESS;  Surgeon: Nadara Mustard, MD;  Location: [facility] OR;  Service: Orthopedics;  Laterality: Left;   Social History   Occupational History  . Not on file  Tobacco Use  . Smoking status: Never Smoker  . Smokeless tobacco: Never Used  Substance and Sexual Activity  . Alcohol use: No    Frequency: Never  . Drug use: No  . Sexual activity: Not on file

## 2017-11-02 ENCOUNTER — Telehealth (INDEPENDENT_AMBULATORY_CARE_PROVIDER_SITE_OTHER): Payer: Self-pay

## 2017-11-02 NOTE — Telephone Encounter (Signed)
Called and sw HH[facility] to advise three layer compression wrap from toes to mid thigh above the pt's incision twice a week. She also asked for PT order eval and treat and per Dr. Lajoyce Corners this is ok.  Will call with questions.

## 2017-11-02 NOTE — Telephone Encounter (Signed)
Angel [facility] with [facility] would like clarification on wound care orders.  Cb# is [phone].  Please advise.  Thank You.

## 2017-11-04 ENCOUNTER — Telehealth: Payer: Self-pay | Admitting: Family Medicine

## 2017-11-04 NOTE — Telephone Encounter (Signed)
Please advise 

## 2017-11-04 NOTE — Telephone Encounter (Signed)
Copied from CRM [id number]. Topic: Quick Communication - See Telephone Encounter >> Nov 04, 2017  3:16 PM Terisa Starr wrote: CRM for notification. See Telephone encounter for: 11/04/17.  [facility] called and said that she was d/c from hospital this week. They prescribed her amlodipine-atorvastatin (CADUET) 10-10 MG tablet & she wants to know if Dr Leretha Pol will refill this now or does she need to wait until she is seen on Tuesday. Please advise. Call back [phone]. She wants the nurse of Dr Leretha Pol to call her back so she can explain this

## 2017-11-08 ENCOUNTER — Ambulatory Visit (INDEPENDENT_AMBULATORY_CARE_PROVIDER_SITE_OTHER): Payer: BLUE CROSS/BLUE SHIELD | Admitting: Family Medicine

## 2017-11-08 ENCOUNTER — Telehealth: Payer: Self-pay | Admitting: Family Medicine

## 2017-11-08 ENCOUNTER — Inpatient Hospital Stay (INDEPENDENT_AMBULATORY_CARE_PROVIDER_SITE_OTHER): Payer: BLUE CROSS/BLUE SHIELD | Admitting: Orthopedic Surgery

## 2017-11-08 ENCOUNTER — Encounter: Payer: Self-pay | Admitting: Family Medicine

## 2017-11-08 ENCOUNTER — Other Ambulatory Visit: Payer: Self-pay

## 2017-11-08 VITALS — BP 144/62 | HR 98 | Temp 98.9°F | Ht 60.43 in | Wt 225.4 lb

## 2017-11-08 DIAGNOSIS — I10 Essential (primary) hypertension: Secondary | ICD-10-CM | POA: Diagnosis not present

## 2017-11-08 DIAGNOSIS — L97929 Non-pressure chronic ulcer of unspecified part of left lower leg with unspecified severity: Secondary | ICD-10-CM

## 2017-11-08 DIAGNOSIS — S8012XD Contusion of left lower leg, subsequent encounter: Secondary | ICD-10-CM | POA: Diagnosis not present

## 2017-11-08 DIAGNOSIS — D649 Anemia, unspecified: Secondary | ICD-10-CM | POA: Diagnosis not present

## 2017-11-08 MED ORDER — AMLODIPINE BESYLATE 10 MG PO TABS: 10.0000 mg | ORAL_TABLET | Freq: Every day | ORAL | 3 refills | Status: DC

## 2017-11-08 MED ORDER — ATORVASTATIN CALCIUM 10 MG PO TABS: 10.0000 mg | ORAL_TABLET | Freq: Every day | ORAL | 3 refills | Status: AC
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
## 2017-11-08 NOTE — Telephone Encounter (Signed)
This is not a new medication. Will refill at tomorrow's appt.

## 2017-11-08 NOTE — Telephone Encounter (Signed)
Copied from CRM [id number]. Topic: Inquiry >> Nov 08, 2017  3:57 PM Maia Petties wrote: Reason for CRM: pt is asking that today's OV notes be sent to [facility] for her short term disability, Fax # [phone], must list Disability Claim # [id number]

## 2017-11-08 NOTE — Patient Instructions (Addendum)
1. Will stop combo pill (amlodipine and atorvastatin). Start the amlodipine you already have. I am sending a prescription for atorvastatin.     IF you received an x-ray today, you will receive an invoice from [facility]. Please contact [facility] at [phone] with questions or concerns regarding your invoice.   IF you received labwork today, you will receive an invoice from [facility]. Please contact [facility] at [phone] with questions or concerns regarding your invoice.   Our billing staff will not be able to assist you with questions regarding bills from these companies.  You will be contacted with the lab results as soon as they are available. The fastest way to get your results is to activate your My Chart account. Instructions are located on the last page of this paperwork. If you have not heard from [place] regarding the results in 2 weeks, please contact this office.

## 2017-11-09 NOTE — Telephone Encounter (Signed)
See below

## 2017-11-10 ENCOUNTER — Ambulatory Visit (INDEPENDENT_AMBULATORY_CARE_PROVIDER_SITE_OTHER): Payer: BLUE CROSS/BLUE SHIELD | Admitting: Orthopedic Surgery

## 2017-11-10 ENCOUNTER — Encounter (INDEPENDENT_AMBULATORY_CARE_PROVIDER_SITE_OTHER): Payer: Self-pay | Admitting: Orthopedic Surgery

## 2017-11-10 DIAGNOSIS — L02416 Cutaneous abscess of left lower limb: Secondary | ICD-10-CM

## 2017-11-10 NOTE — Progress Notes (Signed)
Office Visit Note   Patient: Melissa Forbes           Date of Birth: 1950-09-25           MRN: [id number] Visit Date: 11/10/2017              Requested by: Myles Lipps, MD [address] [place], [place] [address] PCP: Myles Lipps, MD  Chief Complaint  Patient presents with  . Left Leg - Routine Post Op      HPI: Patient presents in follow-up for venous stasis insufficiency left leg with venous ulcers as well as 2 large abscess ulcers on the left thigh.  She denies any problems or pain at this time.  Assessment & Plan: Visit Diagnoses:  1. Abscess of left thigh     Plan: We will harvest the sutures today patient is given a prescription for a knee-high pair of 15 to 20 mm compression stockings to be worn for the venous insufficiency.  Patient states that she lives out of town and would like to follow-up as needed.  Follow-Up Instructions: Return if symptoms worsen or fail to improve.   Ortho Exam  Patient is alert, oriented, no adenopathy, well-dressed, normal affect, normal respiratory effort. Examination the wounds are well-healed there is no drainage no cellulitis no tenderness to palpation.  The venous ulcer in the left calf is completely healed as well with compression wraps.  Imaging: No results found. No images are attached to the encounter.  Labs: Lab Results  Component Value Date   HGBA1C 5.3 07/21/2017   REPTSTATUS 10/31/2017 FINAL 10/26/2017   GRAMSTAIN  10/26/2017    RARE WBC PRESENT, PREDOMINANTLY PMN RARE GRAM POSITIVE COCCI    CULT  10/26/2017    MODERATE STAPHYLOCOCCUS AUREUS SUSCEPTIBILITIES PERFORMED ON PREVIOUS CULTURE WITHIN THE LAST 5 DAYS. NO ANAEROBES ISOLATED Performed at [facility], [address], [place], [place] [address]    LABORGA METHICILLIN RESISTANT STAPHYLOCOCCUS AUREUS 10/26/2017     Lab Results  Component Value Date   ALBUMIN 2.4 (L) 10/26/2017   ALBUMIN 3.3 (L) 07/21/2017    There is no height or  weight on file to calculate BMI.  Orders:  No orders of the defined types were placed in this encounter.  No orders of the defined types were placed in this encounter.    Procedures: No procedures performed  Clinical Data: No additional findings.  ROS:  All other systems negative, except as noted in the HPI. Review of Systems  Objective: Vital Signs: There were no vitals taken for this visit.  Specialty Comments:  No specialty comments available.  PMFS History: Patient Active Problem List   Diagnosis Date Noted  . Abscess of left leg   . Abscess of left thigh   . Abscess 10/25/2017   Past Medical History:  Diagnosis Date  . Diverticulitis   . Hyperlipidemia   . Hypertension     History reviewed. No pertinent family history.  Past Surgical History:  Procedure Laterality Date  . APPLICATION OF WOUND VAC Left 10/26/2017   Procedure: APPLICATION OF WOUND VAC;  Surgeon: Nadara Mustard, MD;  Location: [facility] OR;  Service: Orthopedics;  Laterality: Left;  . I&D EXTREMITY Left 10/26/2017   Procedure: LEFT THIGH ABSCESS INCISION AND DRAINAGE, IRRIGATION AND DEBRIDEMENT;  Surgeon: Nadara Mustard, MD;  Location[facility] OR;  Service: Orthopedics;  Laterality: Left;  . I&D EXTREMITY Left 10/28/2017   Procedure: REPEAT DEBRIDEMENT LEFT THIGH ABSCESS;  Surgeon: Lajoyce Corners,  Randa Evens, MD;  Location: [facility] OR;  Service: Orthopedics;  Laterality: Left;   Social History   Occupational History  . Not on file  Tobacco Use  . Smoking status: Never Smoker  . Smokeless tobacco: Never Used  Substance and Sexual Activity  . Alcohol use: No    Frequency: Never  . Drug use: No  . Sexual activity: Not on file

## 2017-11-11 LAB — MULTIPLE MYELOMA PANEL, SERUM
Albumin SerPl Elph-Mcnc: 3 g/dL (ref 2.9–4.4)
Albumin/Glob SerPl: 0.7 (ref 0.7–1.7)
Alpha 1: 0.3 g/dL (ref 0.0–0.4)
Alpha2 Glob SerPl Elph-Mcnc: 1 g/dL (ref 0.4–1.0)
B-Globulin SerPl Elph-Mcnc: 1.2 g/dL (ref 0.7–1.3)
Gamma Glob SerPl Elph-Mcnc: 2 g/dL — ABNORMAL HIGH (ref 0.4–1.8)
Globulin, Total: 4.5 g/dL — ABNORMAL HIGH (ref 2.2–3.9)
IgA/Immunoglobulin A, Serum: 367 mg/dL — ABNORMAL HIGH (ref 87–352)
IgG (Immunoglobin G), Serum: 2124 mg/dL — ABNORMAL HIGH (ref 700–1600)
IgM (Immunoglobulin M), Srm: 60 mg/dL (ref 26–217)
Total Protein: 7.5 g/dL (ref 6.0–8.5)

## 2017-11-14 ENCOUNTER — Encounter: Payer: Self-pay | Admitting: Family Medicine

## 2017-11-30 NOTE — Telephone Encounter (Signed)
Pt stopped in to drop off Parking Placard paperwork.She will pick up signed document on Friday 5.31.2019 her 5:00 appt. FR

## 2017-12-02 ENCOUNTER — Encounter: Payer: Self-pay | Admitting: Family Medicine

## 2017-12-02 ENCOUNTER — Ambulatory Visit (INDEPENDENT_AMBULATORY_CARE_PROVIDER_SITE_OTHER): Payer: Medicare Other | Admitting: Family Medicine

## 2017-12-02 ENCOUNTER — Other Ambulatory Visit: Payer: Self-pay

## 2017-12-02 VITALS — BP 136/70 | HR 118 | Temp 98.6°F | Ht 60.5 in | Wt 231.4 lb

## 2017-12-02 DIAGNOSIS — S8012XD Contusion of left lower leg, subsequent encounter: Secondary | ICD-10-CM | POA: Diagnosis not present

## 2017-12-02 DIAGNOSIS — Z789 Other specified health status: Secondary | ICD-10-CM

## 2017-12-02 DIAGNOSIS — Z7409 Other reduced mobility: Secondary | ICD-10-CM

## 2017-12-02 DIAGNOSIS — I10 Essential (primary) hypertension: Secondary | ICD-10-CM
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
## 2017-12-02 NOTE — Patient Instructions (Signed)
     IF you received an x-ray today, you will receive an invoice from [facility]. Please contact [facility] at [phone] with questions or concerns regarding your invoice.   IF you received labwork today, you will receive an invoice from [facility]. Please contact [facility] at [phone] with questions or concerns regarding your invoice.   Our billing staff will not be able to assist you with questions regarding bills from these companies.  You will be contacted with the lab results as soon as they are available. The fastest way to get your results is to activate your My Chart account. Instructions are located on the last page of this paperwork. If you have not heard from us regarding the results in 2 weeks, please contact this office.     

## 2017-12-15 ENCOUNTER — Ambulatory Visit: Payer: BLUE CROSS/BLUE SHIELD | Admitting: Family Medicine

## 2018-01-17 ENCOUNTER — Other Ambulatory Visit: Payer: Self-pay

## 2018-01-17 ENCOUNTER — Ambulatory Visit (INDEPENDENT_AMBULATORY_CARE_PROVIDER_SITE_OTHER): Payer: BLUE CROSS/BLUE SHIELD | Admitting: Family Medicine

## 2018-01-17 ENCOUNTER — Encounter: Payer: Self-pay | Admitting: Family Medicine

## 2018-01-17 VITALS — BP 162/80 | HR 98 | Temp 98.8°F | Ht 60.63 in | Wt 236.6 lb

## 2018-01-17 DIAGNOSIS — Z789 Other specified health status: Secondary | ICD-10-CM

## 2018-01-17 DIAGNOSIS — I10 Essential (primary) hypertension: Secondary | ICD-10-CM | POA: Diagnosis not present

## 2018-01-17 DIAGNOSIS — Z7409 Other reduced mobility: Secondary | ICD-10-CM

## 2018-01-17 DIAGNOSIS — S8012XS Contusion of left lower leg, sequela: Secondary | ICD-10-CM | POA: Diagnosis not present

## 2018-01-17 NOTE — Progress Notes (Signed)
7/16/20192:17 PM  Melissa Forbes 06-Aug-1950, 67 y.o. female [id number]  Chief Complaint  Patient presents with  . Follow-up    needs forms faxed [employer] stating that she is still supposed tro be out of work due to incident. Needs forms faxed to insurance company explaining the reason she is to be on long term disability    HPI:   Patient is a 67 y.o. female with past medical history significant for traumatic hematoma of left leg who presents today for followup  Work related injury 06/23/2017 Complicated traumatic hematoma of Left leg requiring extensive wound care eventually surgical mgt Has been wo bp meds for about a week if not more. Did take this morning. Does not need refills Still using cane when walking outside her home and sometimes towards the end of the day if feeling tired She uses electric cart or leans heavily over shopping carts Overall she has not been very active as she does not feel steady nor safe ambulating yet She has intermittent knee stiffness, her leg still gets swollen towards end of the day Starting to have intermittent sharp shooting pain along areas of healed ulcers and incisions. She has not had any falls Is able to drive automatic vehicle Tried to see her PCP again and would not be seen until all records reviewed Needs paperwork to be completed as expected to return to work on July 20th 2019, worked as a Conservation officer, nature at [employer]  01/17/2018 12/02/2017 11/08/2017 10/21/2017 08/02/2017  Falls in the past year? No No No No No  Injury with Fall? - - - - -  Comment - - - - -     Depression screen [facility] 2/9 01/17/2018 12/02/2017 11/08/2017  Decreased Interest 0 0 0  Down, Depressed, Hopeless 0 0 0  PHQ - 2 Score 0 0 0    No Known Allergies  Prior to Admission medications   Medication Sig Start Date End Date Taking? Authorizing Provider  amLODipine (NORVASC) 10 MG tablet Take 1 tablet (10 mg total) by mouth daily. 11/08/17  Yes Myles Lipps, MD    atorvastatin (LIPITOR) 10 MG tablet Take 1 tablet (10 mg total) by mouth daily. 11/08/17  Yes Myles Lipps, MD  Difluprednate (DUREZOL) 0.05 % EMUL Place 1 drop into both eyes daily.  07/30/16  Yes [provider]  timolol (BETIMOL) 0.5 % ophthalmic solution Place 1 drop into both eyes daily.    [provider]    Past Medical History:  Diagnosis Date  . Diverticulitis   . Hyperlipidemia   . Hypertension     Past Surgical History:  Procedure Laterality Date  . APPLICATION OF WOUND VAC Left 10/26/2017   Procedure: APPLICATION OF WOUND VAC;  Surgeon: Nadara Mustard, MD;  Location: [facility] OR;  Service: Orthopedics;  Laterality: Left;  . I&D EXTREMITY Left 10/26/2017   Procedure: LEFT THIGH ABSCESS INCISION AND DRAINAGE, IRRIGATION AND DEBRIDEMENT;  Surgeon: Nadara Mustard, MD;  Location: [facility] OR;  Service: Orthopedics;  Laterality: Left;  . I&D EXTREMITY Left 10/28/2017   Procedure: REPEAT DEBRIDEMENT LEFT THIGH ABSCESS;  Surgeon: Nadara Mustard, MD;  Location: [facility] OR;  Service: Orthopedics;  Laterality: Left;    Social History   Tobacco Use  . Smoking status: Never Smoker  . Smokeless tobacco: Never Used  Substance Use Topics  . Alcohol use: No    Frequency: Never    History reviewed. No pertinent family history.  Review of Systems  Constitutional: Negative for chills and fever.  Respiratory: Negative for cough and shortness of breath.   Cardiovascular: Negative for chest pain, palpitations and leg swelling.  Gastrointestinal: Negative for abdominal pain, nausea and vomiting.   Per hpi  OBJECTIVE:  Blood pressure (!) 162/80, pulse 98, temperature 98.8 F (37.1 C), temperature source Oral, height 5' 0.63" (1.54 m), weight 236 lb 9.6 oz (107.3 kg), SpO2 96 %.  Physical Exam  Constitutional: She is oriented to person, place, and time. She appears well-developed and well-nourished.  HENT:  Head: Normocephalic and atraumatic.  Mouth/Throat: Oropharynx is clear  and moist and mucous membranes are normal. No oropharyngeal exudate.  Eyes: Pupils are equal, round, and reactive to light. EOM are normal. No scleral icterus.  Neck: Neck supple.  Cardiovascular: Normal rate, regular rhythm and normal heart sounds. Exam reveals no gallop and no friction rub.  No murmur heard. Pulmonary/Chest: Effort normal and breath sounds normal. She has no wheezes. She has no rales.  Musculoskeletal: She exhibits edema.  Neurological: She is alert and oriented to person, place, and time.  Skin: Skin is warm and dry.  Left medial thigh healed well. No erythema, warmth or drainage present.   Psychiatric: She has a normal mood and affect.  Nursing note and vitals reviewed. uses cane. Needs assistance standing.    ASSESSMENT and PLAN  1. Impaired mobility and ADLs 2. Traumatic hematoma of left lower leg, sequela Patient continues to slowly progress however still not ambulating independently and therefore unable to return to work. Seems that ambulation negatively impacted by general deconditioning after almost 5 months of limited mobility and fear of fall. Referring to PT. Anticipate patient to be able to return to work after PT completed.  - Ambulatory referral to Physical Therapy  3. Essential hypertension, benign Not controlled, off meds. Encouraged medication adherence.  Encouraged returning to see PCP as [place] and routine care of her chronic conditions is needed.   Return in about 6 weeks (around 02/28/2018), or either PCP or me for PT eval.    Myles LippsIrma M Santiago, MD [facility] [address] [place], [place] [address] Ph.  [phone] Fax [phone]

## 2018-01-17 NOTE — Patient Instructions (Signed)
     IF you received an x-ray today, you will receive an invoice from [facility]. Please contact [facility] at [phone] with questions or concerns regarding your invoice.   IF you received labwork today, you will receive an invoice from [facility]. Please contact [facility] at [phone] with questions or concerns regarding your invoice.   Our billing staff will not be able to assist you with questions regarding bills from these companies.  You will be contacted with the lab results as soon as they are available. The fastest way to get your results is to activate your My Chart account. Instructions are located on the last page of this paperwork. If you have not heard from us regarding the results in 2 weeks, please contact this office.     

## 2018-01-24 ENCOUNTER — Telehealth: Payer: Self-pay | Admitting: Family Medicine
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
## 2018-01-24 NOTE — Telephone Encounter (Signed)
Forms completed and returned to you 

## 2018-01-25 NOTE — Telephone Encounter (Signed)
Form and records faxed on 01/25/09

## 2018-01-26 ENCOUNTER — Telehealth: Payer: Self-pay | Admitting: Family Medicine
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
## 2018-01-27 NOTE — Telephone Encounter (Signed)
See message below: FMLA/Disability

## 2018-01-28 NOTE — Telephone Encounter (Signed)
Can you please look into this. thanks

## 2018-01-30 NOTE — Telephone Encounter (Signed)
All forms were faxed into the insurance company on 01/25/18

## 2018-11-13 ENCOUNTER — Telehealth: Payer: Self-pay | Admitting: Family Medicine

## 2018-11-13 NOTE — Telephone Encounter (Signed)
Copied from CRM [phone]. Topic: General - Inquiry >> Nov 13, 2018 12:24 PM Lynne Logan D wrote: Reason for CRM: Automated records stated they faxed over a request for medical records on 11/09/18 and would like a call back letting them know it was received. They are faxing over another request today. CB# [phone]

## 2018-11-14 NOTE — Telephone Encounter (Signed)
I called CB# [phone] and left a vm that PCP had received a release from them.

## 2019-01-02 ENCOUNTER — Other Ambulatory Visit: Payer: Self-pay | Admitting: Family Medicine

## 2019-04-05 IMAGING — MR MR FEMUR*L* W/O CM
4 of 7 series · 18 of 40 positions shown · IV contrast (Y)
Comparison: None.

CLINICAL DATA: Distal medial thigh wound.

EXAM:
MR OF THE LEFT FEMUR WITHOUT CONTRAST
TECHNIQUE: Multiplanar, multisequence MR imaging of the left thigh was
performed. No intravenous contrast was administered.

[Series 5: T1 · coronal · 4.0mm · 0.94mm/px · 6 of 57 slices shown (1 of 2)]
[im 1/57]
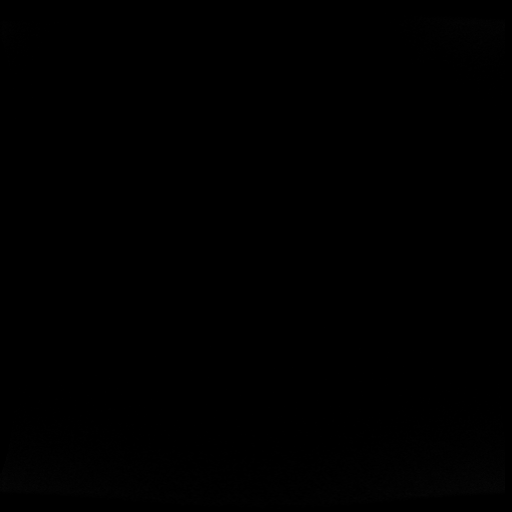
[im 12/57]
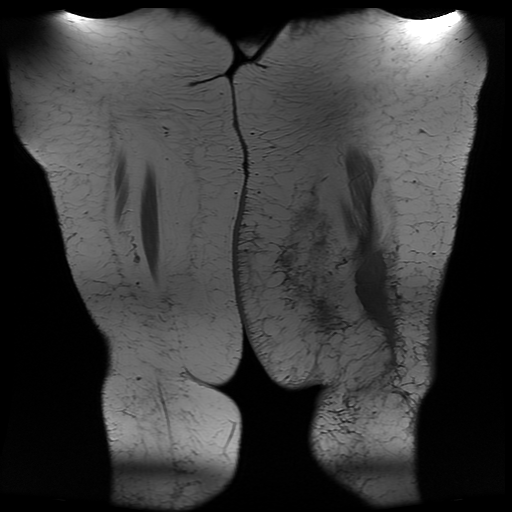
[im 23/57]
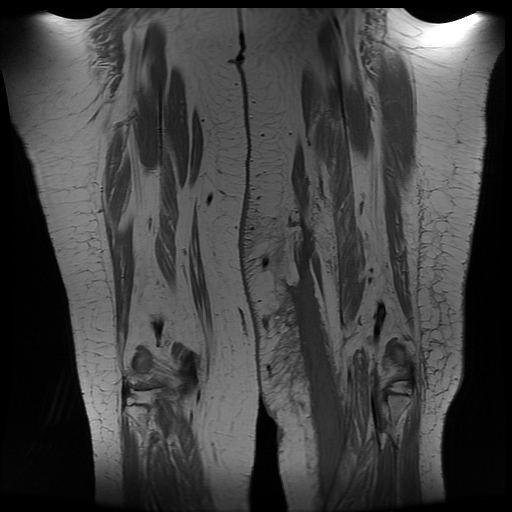
[im 34/57]
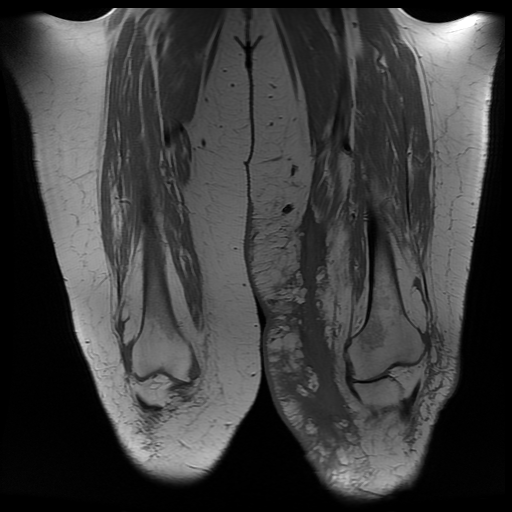
[im 45/57]
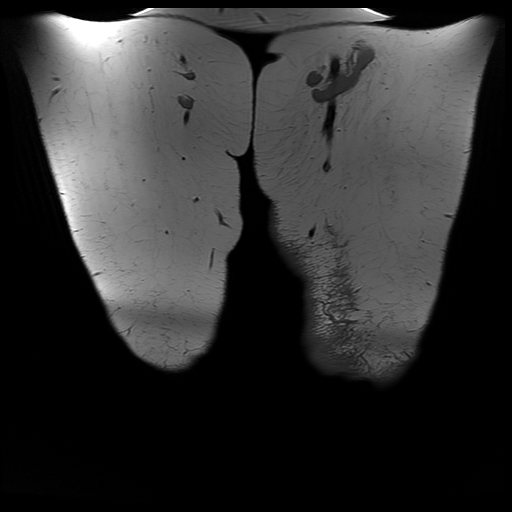
[im 57/57]
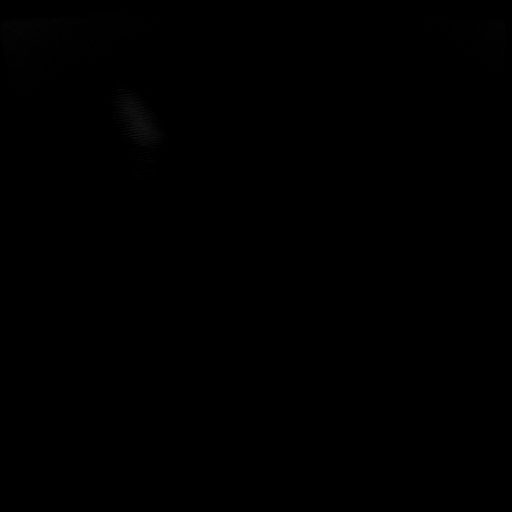

[Series 7: T1 fat-sat · axial · 5.0mm · 0.47mm/px · z∈[-101,+236]mm · 3 of 65 slices shown]
[im 13/65]
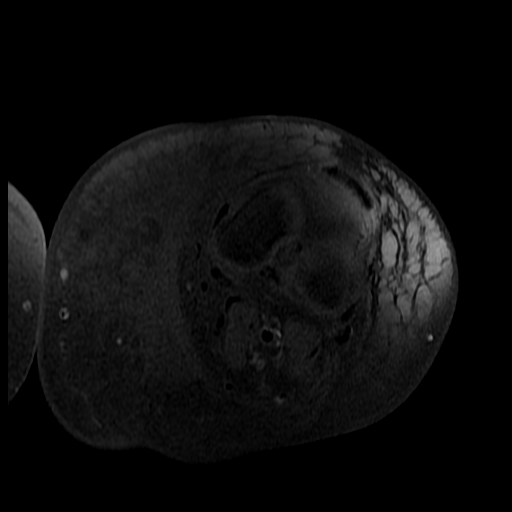
[im 39/65]
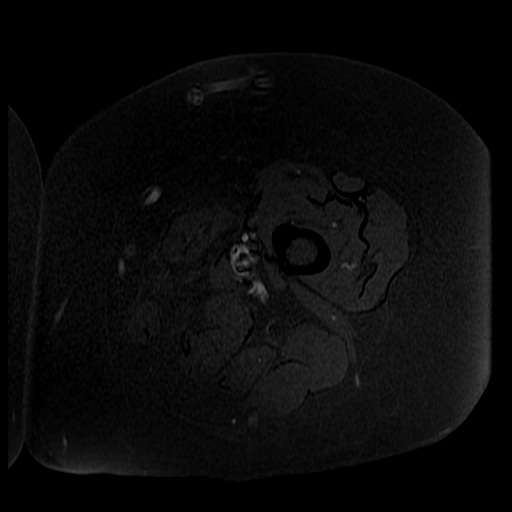
[im 65/65]
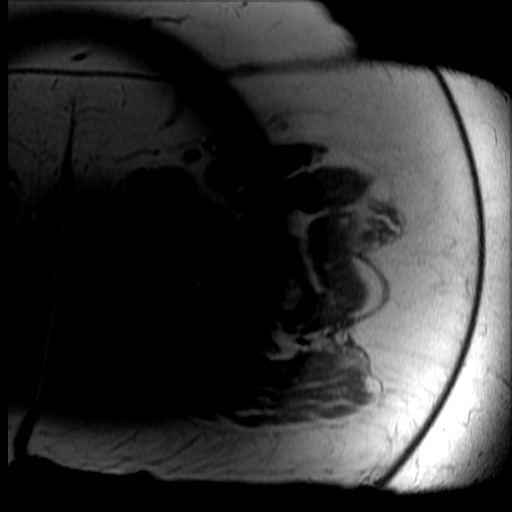

[Series 8: T1 · axial · 5.0mm · 0.47mm/px · z∈[-101,+236]mm · 3 of 65 slices shown (2 of 2)]
[im 13/65]
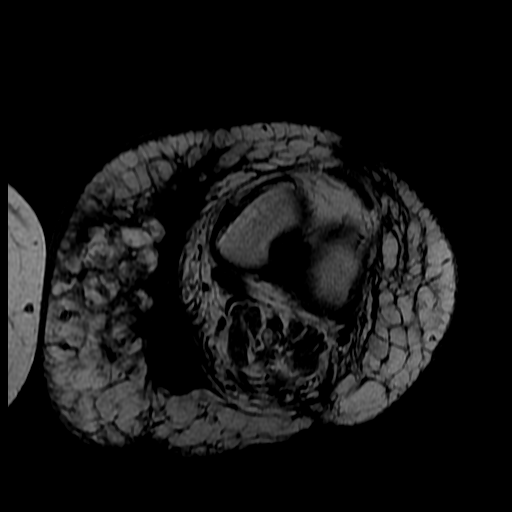
[im 39/65]
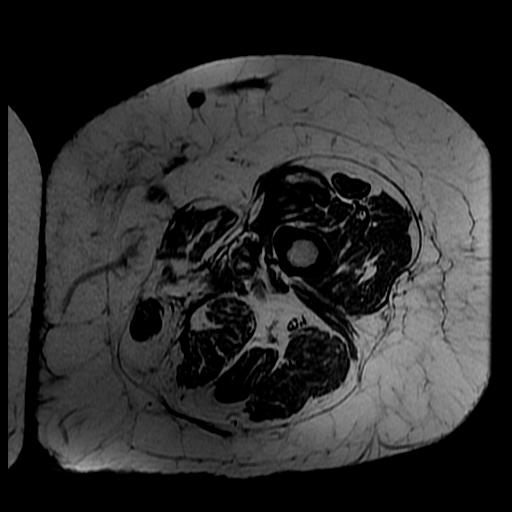
[im 65/65]
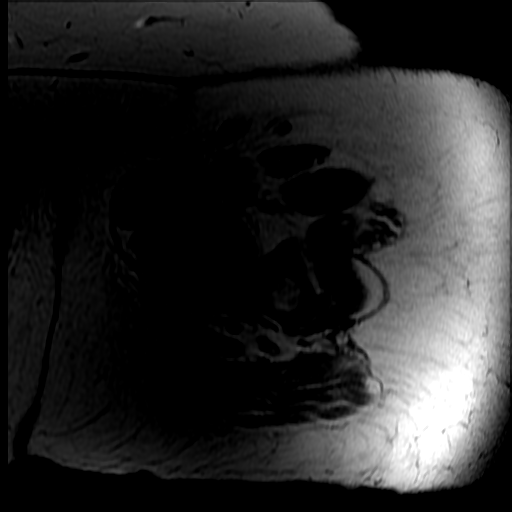

[Series 9: T2 fat-sat · axial · 5.0mm · 0.47mm/px · z∈[-179,+236]mm · 6 of 65 slices shown]
[im 1/65]
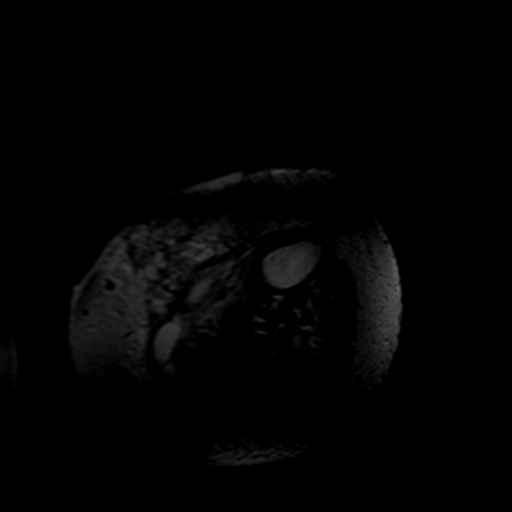
[im 13/65]
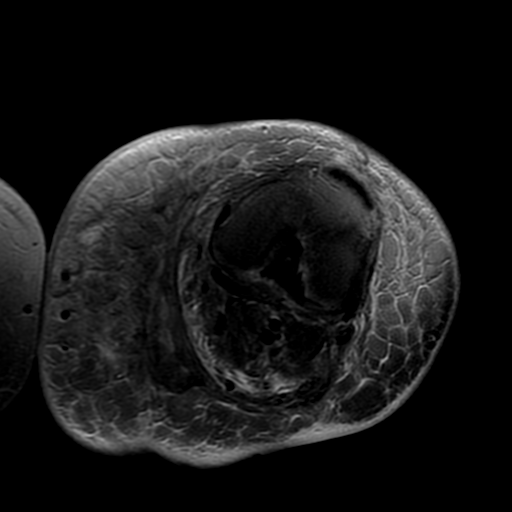
[im 26/65]
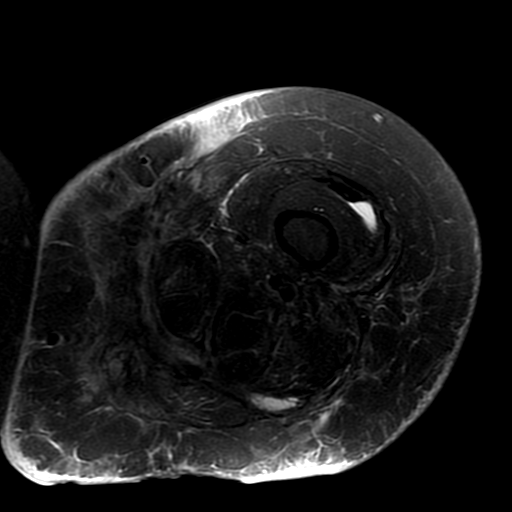
[im 39/65]
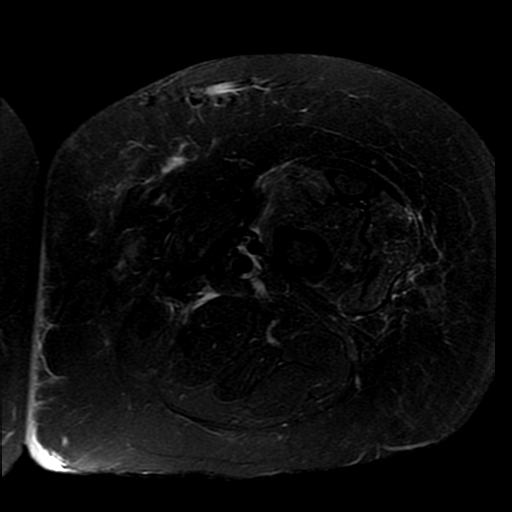
[im 52/65]
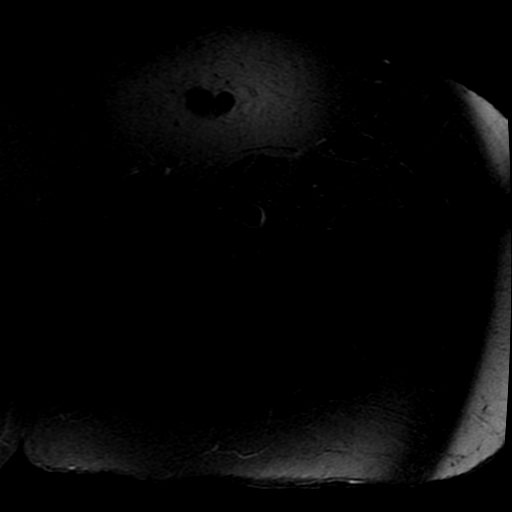
[im 65/65]
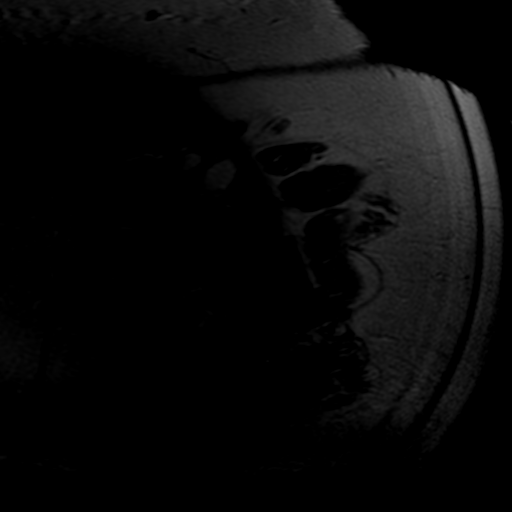

[18 of 40 positions shown; findings below may reference images not displayed]

FINDINGS: Bones/Joint/Cartilage

No suspicious marrow signal abnormality. No acute fracture or
dislocation. Degenerative subchondral cyst in the right medial
tibial plateau. Small bilateral knee joint effusions.

Ligaments

The ACL, PCL, MCL, and LCL in both knees are grossly intact.

Muscles and Tendons

Faint edema within the mid sartorius muscle. Mild diffuse fatty
muscle atrophy. The visualized tendons are unremarkable.

Soft tissues

Large ulceration along the distal medial right thigh extending to
the superficial fascia. There is significant soft tissue thickening
adjacent to the fascia extending from the distal thigh into the
lower leg over a distance of approximately 24 cm. At the level of
the proximal tibia, there is a thick-walled 4.6 x 1.8 x 10.2 cm
fluid collection. There is a smaller 0.6 x 3.7 x 6.1 cm thick walled
fluid collection along the posterior superficial fascia of the
distal thigh which communicates with the induration medially. There
is prominent skin thickening and subcutaneous edema about the distal
thigh extending into the lower leg.
IMPRESSION: 1. Large ulceration along the distal medial right thigh extending to
the superficial fascia with prominent induration along the fascia
extending from the distal thigh into the lower leg, spanning a
distance of approximately 24 cm. 10.2 cm fluid collection medially
at the level of the proximal tibia and 6.1 cm fluid collection
posteriorly in the distal thigh, both of which communicate with the
large area of induration, are concerning for abscesses.
2. Prominent skin thickening and subcutaneous edema of the medial
distal thigh and lower leg, nonspecific, but suspicious for
cellulitis.
3. No evidence of osteomyelitis.
